# Patient Record
Sex: Female | Born: 1980 | Hispanic: Yes | Marital: Married | Smoking: Never smoker
Health system: Southern US, Community
[De-identification: ages and names within clinical notes are randomized; demographics above are authoritative.]

## PROBLEM LIST (undated history)

## (undated) ENCOUNTER — Inpatient Hospital Stay (HOSPITAL_COMMUNITY): Payer: Self-pay

## (undated) DIAGNOSIS — Z789 Other specified health status: Secondary | ICD-10-CM

## (undated) DIAGNOSIS — F32A Depression, unspecified: Secondary | ICD-10-CM

## (undated) DIAGNOSIS — D649 Anemia, unspecified: Secondary | ICD-10-CM

## (undated) DIAGNOSIS — R87619 Unspecified abnormal cytological findings in specimens from cervix uteri: Secondary | ICD-10-CM

## (undated) DIAGNOSIS — F329 Major depressive disorder, single episode, unspecified: Secondary | ICD-10-CM

## (undated) DIAGNOSIS — IMO0002 Reserved for concepts with insufficient information to code with codable children: Secondary | ICD-10-CM

## (undated) DIAGNOSIS — N83209 Unspecified ovarian cyst, unspecified side: Secondary | ICD-10-CM

## (undated) HISTORY — PX: OVARIAN CYST REMOVAL: SHX89

## (undated) HISTORY — PX: EXPLORATORY LAPAROTOMY: SUR591

---

## 2009-05-07 ENCOUNTER — Emergency Department (HOSPITAL_COMMUNITY): Admission: EM | Admit: 2009-05-07 | Discharge: 2009-05-08 | Payer: Self-pay | Admitting: Emergency Medicine

## 2010-01-21 ENCOUNTER — Ambulatory Visit
Admission: AD | Admit: 2010-01-21 | Discharge: 2010-01-21 | Payer: Self-pay | Source: Home / Self Care | Admitting: Family Medicine

## 2010-01-25 ENCOUNTER — Inpatient Hospital Stay (HOSPITAL_COMMUNITY)
Admission: AD | Admit: 2010-01-25 | Discharge: 2010-01-25 | Payer: Self-pay | Source: Home / Self Care | Attending: Family Medicine | Admitting: Family Medicine

## 2010-01-31 ENCOUNTER — Ambulatory Visit (HOSPITAL_COMMUNITY)
Admission: RE | Admit: 2010-01-31 | Discharge: 2010-01-31 | Payer: Self-pay | Source: Home / Self Care | Attending: Obstetrics & Gynecology | Admitting: Obstetrics & Gynecology

## 2010-02-07 ENCOUNTER — Ambulatory Visit: Payer: Self-pay | Admitting: Family Medicine

## 2010-02-17 NOTE — L&D Delivery Note (Signed)
Delivery Note At 8:35 AM a viable female was delivered via Vaginal, Spontaneous Delivery (Presentation: Right Occiput Anterior).  APGAR: 9, 9; weight 5 lb 11.4 oz (2590 g).   Placenta status: Intact, Spontaneous.  Cord: 3 vessels with the following complications: none.   Anesthesia: None  Episiotomy: None Lacerations: 1st degree Suture Repair: 3.0 monocryl Est. Blood Loss (mL): 200  Mom to postpartum.  Baby to nursery-stable.  [name] 09/22/2010, 9:01 AM

## 2010-04-05 ENCOUNTER — Inpatient Hospital Stay (HOSPITAL_COMMUNITY)
Admission: AD | Admit: 2010-04-05 | Discharge: 2010-04-05 | Disposition: A | Discharge status: Home / Self Care | Payer: Medicaid Other | Source: Ambulatory Visit | Attending: Obstetrics & Gynecology | Admitting: Obstetrics & Gynecology

## 2010-04-05 ENCOUNTER — Inpatient Hospital Stay (HOSPITAL_COMMUNITY): Payer: Medicaid Other

## 2010-04-05 DIAGNOSIS — J069 Acute upper respiratory infection, unspecified: Secondary | ICD-10-CM

## 2010-04-05 DIAGNOSIS — O9989 Other specified diseases and conditions complicating pregnancy, childbirth and the puerperium: Secondary | ICD-10-CM

## 2010-04-05 DIAGNOSIS — R109 Unspecified abdominal pain: Secondary | ICD-10-CM | POA: Insufficient documentation

## 2010-04-05 DIAGNOSIS — O99891 Other specified diseases and conditions complicating pregnancy: Secondary | ICD-10-CM | POA: Insufficient documentation

## 2010-04-05 LAB — URINALYSIS, ROUTINE W REFLEX MICROSCOPIC
Hgb urine dipstick: NEGATIVE
Nitrite: NEGATIVE
Specific Gravity, Urine: 1.02 (ref 1.005–1.030)
Urobilinogen, UA: 0.2 mg/dL (ref 0.0–1.0)
pH: 7.5 (ref 5.0–8.0)

## 2010-04-05 LAB — DIFFERENTIAL
Basophils Absolute: 0 10*3/uL (ref 0.0–0.1)
Basophils Relative: 0 % (ref 0–1)
Lymphocytes Relative: 12 % (ref 12–46)
Monocytes Relative: 9 % (ref 3–12)
Neutro Abs: 7.3 10*3/uL (ref 1.7–7.7)
Neutrophils Relative %: 78 % — ABNORMAL HIGH (ref 43–77)

## 2010-04-05 LAB — COMPREHENSIVE METABOLIC PANEL
ALT: 17 U/L (ref 0–35)
AST: 19 U/L (ref 0–37)
Alkaline Phosphatase: 41 U/L (ref 39–117)
CO2: 23 mEq/L (ref 19–32)
Chloride: 105 mEq/L (ref 96–112)
GFR calc Af Amer: >60 mL/min (ref >60)
GFR calc non Af Amer: >60 mL/min (ref >60)
Sodium: 134 mEq/L — ABNORMAL LOW (ref 135–145)
Total Bilirubin: 0.4 mg/dL (ref 0.3–1.2)

## 2010-04-05 LAB — CBC
Hemoglobin: 12.1 g/dL (ref 12.0–15.0)
RBC: 4.02 MIL/uL (ref 3.87–5.11)

## 2010-04-30 LAB — URINALYSIS, ROUTINE W REFLEX MICROSCOPIC
Bilirubin Urine: NEGATIVE
Ketones, ur: NEGATIVE mg/dL
Nitrite: NEGATIVE
Urobilinogen, UA: 0.2 mg/dL (ref 0.0–1.0)
pH: 7.5 (ref 5.0–8.0)

## 2010-04-30 LAB — CBC
MCH: 31 pg (ref 26.0–34.0)
MCV: 90.9 fL (ref 78.0–100.0)
Platelets: 246 10*3/uL (ref 150–400)
RBC: 4.2 MIL/uL (ref 3.87–5.11)

## 2010-05-08 ENCOUNTER — Other Ambulatory Visit: Payer: Self-pay | Admitting: Family Medicine

## 2010-05-08 DIAGNOSIS — Z3689 Encounter for other specified antenatal screening: Secondary | ICD-10-CM

## 2010-05-08 LAB — RPR: RPR: NONREACTIVE

## 2010-05-08 LAB — TYPE AND SCREEN: Antibody Screen: NEGATIVE

## 2010-05-08 LAB — HIV ANTIBODY (ROUTINE TESTING W REFLEX): HIV: NONREACTIVE

## 2010-05-13 ENCOUNTER — Ambulatory Visit (HOSPITAL_COMMUNITY)
Admission: RE | Admit: 2010-05-13 | Discharge: 2010-05-13 | Disposition: A | Discharge status: Home / Self Care | Payer: Medicaid Other | Source: Ambulatory Visit | Attending: Family Medicine | Admitting: Family Medicine

## 2010-05-13 ENCOUNTER — Encounter (HOSPITAL_COMMUNITY): Payer: Self-pay

## 2010-05-13 DIAGNOSIS — Z363 Encounter for antenatal screening for malformations: Secondary | ICD-10-CM | POA: Insufficient documentation

## 2010-05-13 DIAGNOSIS — Z1389 Encounter for screening for other disorder: Secondary | ICD-10-CM | POA: Insufficient documentation

## 2010-05-13 DIAGNOSIS — O358XX0 Maternal care for other (suspected) fetal abnormality and damage, not applicable or unspecified: Secondary | ICD-10-CM | POA: Insufficient documentation

## 2010-05-13 DIAGNOSIS — Z3689 Encounter for other specified antenatal screening: Secondary | ICD-10-CM

## 2010-05-13 LAB — URINALYSIS, ROUTINE W REFLEX MICROSCOPIC
Bilirubin Urine: NEGATIVE
Glucose, UA: NEGATIVE mg/dL
Hgb urine dipstick: NEGATIVE
Ketones, ur: NEGATIVE mg/dL
Nitrite: NEGATIVE
Protein, ur: NEGATIVE mg/dL
Specific Gravity, Urine: 1.013 (ref 1.005–1.030)
Urobilinogen, UA: 0.2 mg/dL (ref 0.0–1.0)
pH: 6.5 (ref 5.0–8.0)

## 2010-05-13 LAB — DIFFERENTIAL
Basophils Absolute: 0 10*3/uL (ref 0.0–0.1)
Basophils Relative: 0 % (ref 0–1)
Eosinophils Absolute: 0.2 K/uL (ref 0.0–0.7)
Eosinophils Relative: 1 % (ref 0–5)
Lymphocytes Relative: 27 % (ref 12–46)
Lymphs Abs: 4 K/uL (ref 0.7–4.0)
Monocytes Absolute: 1 K/uL (ref 0.1–1.0)
Monocytes Relative: 7 % (ref 3–12)
Neutro Abs: 9.8 10*3/uL — ABNORMAL HIGH (ref 1.7–7.7)
Neutrophils Relative %: 65 % (ref 43–77)

## 2010-05-13 LAB — POCT PREGNANCY, URINE: Preg Test, Ur: NEGATIVE

## 2010-05-13 LAB — CBC
HCT: 39.4 % (ref 36.0–46.0)
Hemoglobin: 12.9 g/dL (ref 12.0–15.0)
MCHC: 32.6 g/dL (ref 30.0–36.0)
MCV: 92.5 fL (ref 78.0–100.0)
Platelets: 263 10*3/uL (ref 150–400)
RBC: 4.26 MIL/uL (ref 3.87–5.11)
RDW: 14.3 % (ref 11.5–15.5)
WBC: 14.9 10*3/uL — ABNORMAL HIGH (ref 4.0–10.5)

## 2010-05-13 LAB — COMPREHENSIVE METABOLIC PANEL WITH GFR
ALT: 18 U/L (ref 0–35)
AST: 23 U/L (ref 0–37)
Albumin: 3.7 g/dL (ref 3.5–5.2)
Alkaline Phosphatase: 51 U/L (ref 39–117)
Calcium: 8.5 mg/dL (ref 8.4–10.5)
GFR calc Af Amer: >60 mL/min (ref >60)
Potassium: 3.5 meq/L (ref 3.5–5.1)
Sodium: 139 meq/L (ref 135–145)
Total Protein: 7 g/dL (ref 6.0–8.3)

## 2010-05-13 LAB — COMPREHENSIVE METABOLIC PANEL
BUN: 10 mg/dL (ref 6–23)
CO2: 22 mEq/L (ref 19–32)
Chloride: 110 mEq/L (ref 96–112)
Creatinine, Ser: 0.57 mg/dL (ref 0.4–1.2)
GFR calc non Af Amer: >60 mL/min (ref >60)
Glucose, Bld: 98 mg/dL (ref 70–99)
Total Bilirubin: 0.6 mg/dL (ref 0.3–1.2)

## 2010-05-13 LAB — WET PREP, GENITAL
Trich, Wet Prep: NONE SEEN
Yeast Wet Prep HPF POC: NONE SEEN

## 2010-05-13 LAB — GC/CHLAMYDIA PROBE AMP, GENITAL
Chlamydia, DNA Probe: NEGATIVE
GC Probe Amp, Genital: NEGATIVE

## 2010-05-13 LAB — RPR: RPR Ser Ql: NONREACTIVE

## 2010-05-13 LAB — LIPASE, BLOOD: Lipase: 25 U/L (ref 11–59)

## 2010-08-08 ENCOUNTER — Other Ambulatory Visit: Payer: Self-pay | Admitting: Obstetrics & Gynecology

## 2010-08-08 DIAGNOSIS — N920 Excessive and frequent menstruation with regular cycle: Secondary | ICD-10-CM

## 2010-08-09 ENCOUNTER — Ambulatory Visit (HOSPITAL_COMMUNITY)
Admission: RE | Admit: 2010-08-09 | Discharge: 2010-08-09 | Disposition: A | Discharge status: Home / Self Care | Payer: Self-pay | Source: Ambulatory Visit | Attending: Obstetrics & Gynecology | Admitting: Obstetrics & Gynecology

## 2010-08-09 DIAGNOSIS — Z3689 Encounter for other specified antenatal screening: Secondary | ICD-10-CM | POA: Insufficient documentation

## 2010-08-09 DIAGNOSIS — N920 Excessive and frequent menstruation with regular cycle: Secondary | ICD-10-CM

## 2010-08-09 DIAGNOSIS — O4690 Antepartum hemorrhage, unspecified, unspecified trimester: Secondary | ICD-10-CM | POA: Insufficient documentation

## 2010-08-29 ENCOUNTER — Inpatient Hospital Stay (HOSPITAL_COMMUNITY)
Admission: AD | Admit: 2010-08-29 | Discharge: 2010-08-30 | DRG: 778 | Disposition: A | Discharge status: Home / Self Care | Payer: Self-pay | Source: Ambulatory Visit | Attending: Obstetrics & Gynecology | Admitting: Obstetrics & Gynecology

## 2010-08-29 ENCOUNTER — Encounter (HOSPITAL_COMMUNITY): Payer: Self-pay | Admitting: *Deleted

## 2010-08-29 DIAGNOSIS — O4700 False labor before 37 completed weeks of gestation, unspecified trimester: Principal | ICD-10-CM | POA: Diagnosis present

## 2010-08-29 DIAGNOSIS — Z3480 Encounter for supervision of other normal pregnancy, unspecified trimester: Secondary | ICD-10-CM

## 2010-08-29 HISTORY — DX: Other specified health status: Z78.9

## 2010-08-29 LAB — CBC
HCT: 36.3 % (ref 36.0–46.0)
HCT: 37.4 % (ref 36.0–46.0)
Hemoglobin: 12.4 g/dL (ref 12.0–15.0)
MCH: 30.7 pg (ref 26.0–34.0)
MCHC: 34.2 g/dL (ref 30.0–36.0)
MCV: 89.7 fL (ref 78.0–100.0)
RDW: 13.5 % (ref 11.5–15.5)
WBC: 10.9 10*3/uL — ABNORMAL HIGH (ref 4.0–10.5)

## 2010-08-29 LAB — URINALYSIS, ROUTINE W REFLEX MICROSCOPIC
Glucose, UA: NEGATIVE mg/dL
Leukocytes, UA: NEGATIVE
Nitrite: NEGATIVE
Specific Gravity, Urine: 1.01 (ref 1.005–1.030)
pH: 6.5 (ref 5.0–8.0)

## 2010-08-29 LAB — URINE MICROSCOPIC-ADD ON

## 2010-08-29 LAB — WET PREP, GENITAL: Yeast Wet Prep HPF POC: NONE SEEN

## 2010-08-29 MED ORDER — OXYTOCIN 20 UNITS IN LACTATED RINGERS INFUSION - SIMPLE: 125.0000 mL/h | Freq: Once | INTRAVENOUS | Status: DC

## 2010-08-29 MED ORDER — LACTATED RINGERS IV SOLN
INTRAVENOUS | Status: DC
  Administered 2010-08-29 – 2010-08-30 (×3): via INTRAVENOUS

## 2010-08-29 MED ORDER — OXYCODONE-ACETAMINOPHEN 5-325 MG PO TABS: 2.0000 | ORAL_TABLET | ORAL | Status: DC | PRN

## 2010-08-29 MED ORDER — FOLIC ACID 1 MG PO TABS
1.0000 mg | ORAL_TABLET | Freq: Every day | ORAL | Status: DC
  Administered 2010-08-30: 1 mg via ORAL
  Filled 2010-08-29 (×3): qty 1

## 2010-08-29 MED ORDER — ACETAMINOPHEN 325 MG PO TABS: 650.0000 mg | ORAL_TABLET | ORAL | Status: DC | PRN

## 2010-08-29 MED ORDER — LACTATED RINGERS IV SOLN: 500.0000 mL | INTRAVENOUS | Status: DC | PRN

## 2010-08-29 MED ORDER — PRENATAL PLUS 27-1 MG PO TABS
1.0000 | ORAL_TABLET | Freq: Every day | ORAL | Status: DC
  Administered 2010-08-30: 1 via ORAL
  Filled 2010-08-29: qty 1

## 2010-08-29 MED ORDER — IBUPROFEN 600 MG PO TABS: 600.0000 mg | ORAL_TABLET | Freq: Four times a day (QID) | ORAL | Status: DC | PRN

## 2010-08-29 MED ORDER — FLEET ENEMA 7-19 GM/118ML RE ENEM: 1.0000 | ENEMA | RECTAL | Status: DC | PRN

## 2010-08-29 MED ORDER — CITRIC ACID-SODIUM CITRATE 334-500 MG/5ML PO SOLN: 30.0000 mL | ORAL | Status: DC | PRN

## 2010-08-29 MED ORDER — LIDOCAINE HCL (PF) 1 % IJ SOLN: 30.0000 mL | Freq: Once | INTRAMUSCULAR | Status: AC | PRN

## 2010-08-29 MED ORDER — ONDANSETRON HCL 4 MG/2ML IJ SOLN: 4.0000 mg | Freq: Four times a day (QID) | INTRAMUSCULAR | Status: DC | PRN

## 2010-08-29 NOTE — Progress Notes (Signed)
Marissa Little is a 30 y.o. G4P1021 at [redacted]w[redacted]d admitted for latent labor   Subjective: Pt c/o mild vag pressure, mild vaginal bleeding. No acute complaints  Objective: BP 111/61  Pulse 68  Temp(Src) 98.2 F (36.8 C) (Oral)  Resp 18  Ht 4\' 8"  (1.422 m)  Wt 61.236 kg (135 lb)  BMI 30.27 kg/m2      FHT:  FHR: 150 bpm, variability: moderate,  accelerations:  Present,  decelerations:  Absent UC:   irregular, every 2-3 minutes SVE:   Dilation: 4 Effacement (%): 60 Station: -2 Exam by:: Dr. Orvan Falconer +bloody show, no clinical evidence of abruption  Labs: Lab Results  Component Value Date   WBC 11.4* 08/29/2010   HGB 12.8 08/29/2010   HCT 37.4 08/29/2010   MCV 89.7 08/29/2010   PLT 187 08/29/2010    Assessment / Plan: Spontaneous labor, progressing normally  Labor: Progressing normally cont expectant management Fetal Wellbeing:  Category I Pain Control:  Labor support without medications I/D:  n/a Anticipated MOD:  NSVD  Marissa Little 08/29/2010, 6:57 PM

## 2010-08-29 NOTE — Initial Assessments (Signed)
Pt started having bleeding on Tuesday, passed some clots, bleeding continued Wednesday - bleeding like a period,  Pt was seen @ [facility] today, continues to have bleeding, sent to MAU.  Pt also C/O lower abd pain since Tuesday, decreased fetal movement since yesterday.

## 2010-08-29 NOTE — Progress Notes (Signed)
  Marissa Little is a 30 y.o. [id number] at [redacted]w[redacted]d admitted for latent labor.  Subjective: Pt doing well with her contractions. No acute complaints.   Objective: BP 91/57  Pulse 67  Temp(Src) 98.1 F (36.7 C) (Oral)  Resp 18  Ht 4\' 8"  (1.422 m)  Wt 61.236 kg (135 lb)  BMI 30.27 kg/m2      FHT:  FHR: 140 bpm, variability: moderate,  accelerations:  Present,  decelerations:  Absent UC:   regular, every 2-3 minutes SVE:   Dilation: 4.5 Effacement (%): 60 Station: -2;Ballotable Exam by:: [email or web address],rnc SVE unchanged: 4.5/60/-2 at 2355. +bloody show  Labs: Lab Results  Component Value Date   WBC 11.4* 08/29/2010   HGB 12.8 08/29/2010   HCT 37.4 08/29/2010   MCV 89.7 08/29/2010   PLT 187 08/29/2010    Assessment / Plan: latent labor  Labor: cont expectant management due to preterm status. Fetal Wellbeing:  Category I Pain Control:  Labor support without medications I/D:  n/a Anticipated MOD:  NSVD  Ndia Sampath 08/29/2010, 11:53 PM

## 2010-08-29 NOTE — Progress Notes (Signed)
08/29/2010 Marissa Little  Interpreter  I assisted Dr. Natale Milch with plan of care.

## 2010-08-29 NOTE — Progress Notes (Signed)
Passed small clot on Tues.  Decreased fetal movement since yesterday.

## 2010-08-29 NOTE — H&P (Addendum)
Subjective:  Marissa Little is a 30 y.o. G4 P56 female with EDC 09/25/10 at 68 and 1/[redacted] weeks gestation who is being admitted for Preterm labor.  Pt with hx of 2 previous ectopic pregnancies with removal of left ovary and previous vaginal delivery in [place].  Patient reports bleeding, no leaking and contractions which began early today.   Fetal Movement: normal.     Objective:   Vital signs in last 24 hours: Temp:  [98.1 F (36.7 C)] 98.1 F (36.7 C) (07/12 1211) Pulse Rate:  [77] 77  (07/12 1211) Resp:  [18] 18  (07/12 1211) BP: (108)/(70) 108/70 mmHg (07/12 1211) Weight:  [130 lb 12.8 oz (59.33 kg)] 130 lb 12.8 oz (59.33 kg) (07/12 1211)   General:   alert, cooperative and moderate distress  Skin:   normal  HEENT:  PERRLA  Lungs:   clear to auscultation anteriorly  Heart:   regular rate and rhythm, S1, S2 normal, no murmur, click, rub or gallop  Abdomen:  soft, non-tender; bowel sounds normal; no masses,  no organomegaly  Pelvis:  External genitalia: normal general appearance  FHT:  150 BPM  Presentations: cephalic  Cervix:    Dilation: 3.5cm   Effacement: 50%   Station:  -2   Consistency: soft   Position: anterior   Lab Review  O, Rh+, Rubella-immune, Hepatitis B surface antigen non-reactive, GBS negative  One hour GTT: Normal    Assessment/Plan:  36 and 1/[redacted] weeks gestation. Active phase labor.  Admit to L&D.  Pt s/e with PA-S. Agree with above.

## 2010-08-29 NOTE — ED Provider Notes (Addendum)
History   Chief Complaint:  Vaginal Bleeding   Marissa Little is  30 y.o. [id number].  No LMP recorded. Patient is pregnant..  Her pregnancy status is positive.  She presents complaining of Vaginal Bleeding Pt was sent from the HD today after having passed a clot on Tuesday and now having decreased fetal movement.  Per pt, on Tuesday afternoon, she began having vaginal bleeding, bright red and heavy like a period.  This lasted all night Tues night into Wed morning.  She then began passing small clots and some brown-colored blood.  Today while at the HD, she began having bright red bleeding again after having a vaginal exam.  Pt had one episode of vomiting and diarrhea yesterday.  Has been able to eat and drink since then without any problems.  Also complains of pain with urination and a feeling of incomplete bladder emptying.  No fevers or chills.  No N/V/D other than the one episode yesterday.  No CP. No contractions but having painful cramping, esp in the lower abdomen.  +FM but somewhat decreased from normal.  No LOF or gushes as if water broke.  Was 1cm at HD today.  Onset is described as ongoing and has been present for  3 days.      Past Medical History  Diagnosis Date  . No pertinent past medical history     Past Surgical History  Procedure Date  . Ovarian cyst removal     No family history on file.  History  Substance Use Topics  . Smoking status: Never Smoker   . Smokeless tobacco: Not on file  . Alcohol Use: No    Allergies: Allergies no known allergies  No prescriptions prior to admission      Physical Exam   Blood pressure 108/70, pulse 77, temperature 98.1 F (36.7 C), temperature source Oral, resp. rate 18, height 4\' 10"  (1.473 m), weight 130 lb 12.8 oz (59.33 kg).  General: General appearance - alert, well appearing, and in no distress and well hydrated Heart - normal rate and regular rhythm, no murmurs noted Abdomen - tenderness noted most sig in suprapubic  area; gravid;  bowel sounds normal Extremities - peripheral pulses normal, no pedal edema, no clubbing or cyanosis Focused Gynecological Exam: not done at this time  Labs: No results found for this or any previous visit (from the past 24 hour(s)).    Assessment: There is no problem list on file for this patient. 30 yo G4P1021 @ 36 wks sent by HD for vaginal bleeding w/ syptoms c/w UTI   Plan: 1. Check UA to r/o UTI, CBC 2. Continuous monitoring for ctx and FHT  MCGILL,JACQUELYN    ________________________________________ Speculum Exam @ 1430 Beefy red mucosal surface to cervix. Bloody mucousy discharge noted. Sampless taken and sent for Wet prep and GC/Ch. Cervix was 3.5/10/-1 by digital exam. FHT Cat I with ctx every 3-4 minutes, but pt declined pain medication. Will Cont monitoring and recheck cervix in approximately 1 hour.  _______________ Pt admitted to L&D

## 2010-08-29 NOTE — Progress Notes (Signed)
08/29/2010 Marissa Little  Interpreter  I assisted [employer] fast streamer with questions

## 2010-08-30 LAB — GC/CHLAMYDIA PROBE AMP, GENITAL: Chlamydia, DNA Probe: NEGATIVE

## 2010-08-30 NOTE — Progress Notes (Signed)
  Marissa Little is a 30 y.o. [phone] at [redacted]w[redacted]d admitted for ?active labor  Subjective: Doing well, feeling some ctx, baby moving well, still having some vaginal bleeding  Objective: BP 125/86  Pulse 71  Temp(Src) 97.9 F (36.6 C) (Oral)  Resp 20  Ht 4\' 8"  (1.422 m)  Wt 135 lb (61.236 kg)  BMI 30.27 kg/m2 I/O last 3 completed shifts: In: 1447.9 [I.V.:1447.9] Out: -     FHT:  FHR: 130 bpm, variability: moderate,  accelerations:  Present,  decelerations:  Absent UC:   irregular, every 3-7 minutes SVE:   4/60/-2  Labs: Lab Results  Component Value Date   WBC 11.4* 08/29/2010   HGB 12.8 08/29/2010   HCT 37.4 08/29/2010   MCV 89.7 08/29/2010   PLT 187 08/29/2010    Assessment / Plan: 36 week G4P1021 in ?labor; cervical change in MAU but only minimal change since being on L&D  Labor: will have pt walk x2-3 hrs and then recheck cervix.  If no change, will d/c pt home, otherwise will continue expectant mgmt if cervix is >/= 5cm Fetal Wellbeing:  Category I Pain Control:  Labor support without medications    [name] 08/30/2010, 10:03 AM

## 2010-08-30 NOTE — Progress Notes (Signed)
08/30/2010 Calen Geister  Interpreter   Stopped by to check on patient.

## 2010-08-30 NOTE — Progress Notes (Signed)
08/30/2010 Ota Ebersole  Interpreter  I assisted [facility] with plan of care.

## 2010-08-30 NOTE — Discharge Summary (Signed)
Obstetric Discharge Summary Reason for Admission: Labor  Hemoglobin  Date Value Range Status  08/29/2010 12.8  12.0-15.0 (g/dL) Final     HCT  Date Value Range Status  08/29/2010 37.4  36.0-46.0 (%) Final    Discharge Diagnoses: False labor-undelivered  Discharge Information: Date: 08/30/2010 Activity: unrestricted Diet: routine Medications: PNV Condition: stable Instructions: See specific Discharge instructions on false labor Discharge to: home Follow-up Information    Follow up with [facility] on 09/05/2010. (At 1030 AM)          Chancy Milroy 08/30/2010, 3:09 PM

## 2010-08-30 NOTE — Progress Notes (Signed)
Marissa Little is a 30 y.o. [phone] at [redacted]w[redacted]d admitted for latent labor  Subjective: Pt has no acute complaint. Managing contractions without pain medication.   Objective: BP 100/67  Pulse 63  Temp(Src) 97.8 F (36.6 C) (Oral)  Resp 20  Ht 4\' 8"  (1.422 m)  Wt 135 lb (61.236 kg)  BMI 30.27 kg/m2      FHT:  FHR: 130 bpm, variability: moderate,  accelerations:  Present,  decelerations:  Absent UC:   regular, every 2-3 minutes SVE:   Dilation: 4-5 Effacement: 60 Station: -2 Exam done by Marena Chancy and confirmed by [employer]: Lab Results  Component Value Date   WBC 11.4* 08/29/2010   HGB 12.8 08/29/2010   HCT 37.4 08/29/2010   MCV 89.7 08/29/2010   PLT 187 08/29/2010    Assessment / Plan: Latent phase of labor  Labor: latent phase of labor. Continue expectant management due to preterm status Fetal Wellbeing:  Category I Pain Control:  Labor support without medications Anticipated MOD:  NSVD  Ladislao Cohenour 08/30/2010, 3:48 AM

## 2010-08-30 NOTE — Progress Notes (Signed)
Marissa Little is a 30 y.o. G4P1021 at [redacted]w[redacted]d by ultrasound admitted for labor, however, her contractions have stopped. She walked for several hours to see if they would return. Contractions have returned somewhat, but not to the degree she was at when she was admitted yesterday.  Objective: BP 103/63  Pulse 70  Temp(Src) 97.3 F (36.3 C) (Oral)  Resp 18  Ht 4\' 8"  (1.422 m)  Wt 135 lb (61.236 kg)  BMI 30.27 kg/m2 I/O last 3 completed shifts: In: 1447.9 [I.V.:1447.9] Out: -     FHT:  FHR: 140 bpm, variability: moderate,  accelerations:  Present,  decelerations:  Absent UC:   none, regular, every 6-8 minutes SVE: 3/50/-2  Labs: Lab Results  Component Value Date   WBC 11.4* 08/29/2010   HGB 12.8 08/29/2010   HCT 37.4 08/29/2010   MCV 89.7 08/29/2010   PLT 187 08/29/2010    Assessment / Plan: Protracted latent phase  Labor: Stopped Fetal Wellbeing:  Category I Pain Control:  Labor support without medications  Will discharge pt home. Labor has protracted/arrested without getting into active phase. Labor precautions discussed with pt. She will f/u at HD on 19 July at 10:30 as noted in discharge summary.   Lyanna Blystone N 08/30/2010, 3:05 PM

## 2010-09-22 ENCOUNTER — Inpatient Hospital Stay (HOSPITAL_COMMUNITY)
Admission: AD | Admit: 2010-09-22 | Discharge: 2010-09-24 | DRG: 775 | Disposition: A | Discharge status: Home / Self Care | Payer: Medicaid Other | Source: Ambulatory Visit | Attending: Obstetrics & Gynecology | Admitting: Obstetrics & Gynecology

## 2010-09-22 ENCOUNTER — Encounter (HOSPITAL_COMMUNITY): Payer: Self-pay

## 2010-09-22 DIAGNOSIS — Z3480 Encounter for supervision of other normal pregnancy, unspecified trimester: Secondary | ICD-10-CM

## 2010-09-22 HISTORY — DX: Unspecified ovarian cyst, unspecified side: N83.209

## 2010-09-22 HISTORY — DX: Unspecified abnormal cytological findings in specimens from cervix uteri: R87.619

## 2010-09-22 HISTORY — DX: Depression, unspecified: F32.A

## 2010-09-22 HISTORY — DX: Major depressive disorder, single episode, unspecified: F32.9

## 2010-09-22 HISTORY — DX: Anemia, unspecified: D64.9

## 2010-09-22 HISTORY — DX: Reserved for concepts with insufficient information to code with codable children: IMO0002

## 2010-09-22 LAB — CBC
Platelets: 165 10*3/uL (ref 150–400)
RBC: 4.28 MIL/uL (ref 3.87–5.11)
RDW: 13.5 % (ref 11.5–15.5)
WBC: 11.6 10*3/uL — ABNORMAL HIGH (ref 4.0–10.5)

## 2010-09-22 LAB — RPR: RPR Ser Ql: NONREACTIVE

## 2010-09-22 MED ORDER — SIMETHICONE 80 MG PO CHEW: 80.0000 mg | CHEWABLE_TABLET | ORAL | Status: DC | PRN

## 2010-09-22 MED ORDER — IBUPROFEN 600 MG PO TABS: 600.0000 mg | ORAL_TABLET | Freq: Four times a day (QID) | ORAL | Status: DC | PRN

## 2010-09-22 MED ORDER — OXYCODONE-ACETAMINOPHEN 5-325 MG PO TABS: 2.0000 | ORAL_TABLET | ORAL | Status: DC | PRN

## 2010-09-22 MED ORDER — LACTATED RINGERS IV SOLN: 500.0000 mL | INTRAVENOUS | Status: DC | PRN

## 2010-09-22 MED ORDER — SENNOSIDES-DOCUSATE SODIUM 8.6-50 MG PO TABS
2.0000 | ORAL_TABLET | Freq: Every day | ORAL | Status: DC
  Administered 2010-09-22 – 2010-09-23 (×2): 2 via ORAL

## 2010-09-22 MED ORDER — DIBUCAINE 1 % RE OINT: 1.0000 "application " | TOPICAL_OINTMENT | RECTAL | Status: DC | PRN

## 2010-09-22 MED ORDER — IBUPROFEN 600 MG PO TABS
600.0000 mg | ORAL_TABLET | Freq: Four times a day (QID) | ORAL | Status: DC
  Administered 2010-09-22 – 2010-09-24 (×8): 600 mg via ORAL
  Filled 2010-09-22 (×8): qty 1

## 2010-09-22 MED ORDER — ACETAMINOPHEN 325 MG PO TABS: 650.0000 mg | ORAL_TABLET | ORAL | Status: DC | PRN

## 2010-09-22 MED ORDER — BENZOCAINE-MENTHOL 20-0.5 % EX AERO
1.0000 "application " | INHALATION_SPRAY | CUTANEOUS | Status: DC | PRN
  Administered 2010-09-22: 1 via TOPICAL

## 2010-09-22 MED ORDER — OXYTOCIN 20 UNITS IN LACTATED RINGERS INFUSION - SIMPLE
125.0000 mL/h | Freq: Once | INTRAVENOUS | Status: DC
  Filled 2010-09-22: qty 1000

## 2010-09-22 MED ORDER — PRENATAL PLUS 27-1 MG PO TABS
1.0000 | ORAL_TABLET | Freq: Every day | ORAL | Status: DC
  Administered 2010-09-23: 1 via ORAL
  Filled 2010-09-22: qty 1

## 2010-09-22 MED ORDER — ONDANSETRON HCL 4 MG/2ML IJ SOLN: 4.0000 mg | INTRAMUSCULAR | Status: DC | PRN

## 2010-09-22 MED ORDER — NALBUPHINE SYRINGE 5 MG/0.5 ML
5.0000 mg | INJECTION | INTRAMUSCULAR | Status: DC | PRN
  Administered 2010-09-22 (×2): 5 mg via INTRAVENOUS
  Filled 2010-09-22 (×3): qty 0.5

## 2010-09-22 MED ORDER — OXYTOCIN 10 UNIT/ML IJ SOLN
INTRAMUSCULAR | Status: AC
  Filled 2010-09-22: qty 2

## 2010-09-22 MED ORDER — ONDANSETRON HCL 4 MG/2ML IJ SOLN: 4.0000 mg | Freq: Four times a day (QID) | INTRAMUSCULAR | Status: DC | PRN

## 2010-09-22 MED ORDER — ZOLPIDEM TARTRATE 5 MG PO TABS: 5.0000 mg | ORAL_TABLET | Freq: Every evening | ORAL | Status: DC | PRN

## 2010-09-22 MED ORDER — NALOXONE HCL 0.4 MG/ML IJ SOLN
INTRAMUSCULAR | Status: AC
  Filled 2010-09-22: qty 1

## 2010-09-22 MED ORDER — FLEET ENEMA 7-19 GM/118ML RE ENEM: 1.0000 | ENEMA | RECTAL | Status: DC | PRN

## 2010-09-22 MED ORDER — TETANUS-DIPHTH-ACELL PERTUSSIS 5-2.5-18.5 LF-MCG/0.5 IM SUSP
0.5000 mL | Freq: Once | INTRAMUSCULAR | Status: AC
  Administered 2010-09-23: 0.5 mL via INTRAMUSCULAR
  Filled 2010-09-22: qty 0.5

## 2010-09-22 MED ORDER — WITCH HAZEL-GLYCERIN EX PADS: 1.0000 "application " | MEDICATED_PAD | CUTANEOUS | Status: DC | PRN

## 2010-09-22 MED ORDER — BENZOCAINE-MENTHOL 20-0.5 % EX AERO
INHALATION_SPRAY | CUTANEOUS | Status: AC
  Administered 2010-09-22: 1 via TOPICAL
  Filled 2010-09-22: qty 56

## 2010-09-22 MED ORDER — LACTATED RINGERS IV SOLN
INTRAVENOUS | Status: DC
  Administered 2010-09-22: 1000 mL via INTRAVENOUS

## 2010-09-22 MED ORDER — LANOLIN HYDROUS EX OINT: TOPICAL_OINTMENT | CUTANEOUS | Status: DC | PRN

## 2010-09-22 MED ORDER — OXYCODONE-ACETAMINOPHEN 5-325 MG PO TABS: 1.0000 | ORAL_TABLET | ORAL | Status: DC | PRN

## 2010-09-22 MED ORDER — DIPHENHYDRAMINE HCL 25 MG PO CAPS: 25.0000 mg | ORAL_CAPSULE | Freq: Four times a day (QID) | ORAL | Status: DC | PRN

## 2010-09-22 MED ORDER — LIDOCAINE HCL (PF) 1 % IJ SOLN
30.0000 mL | INTRAMUSCULAR | Status: DC | PRN
  Filled 2010-09-22 (×2): qty 30

## 2010-09-22 MED ORDER — CITRIC ACID-SODIUM CITRATE 334-500 MG/5ML PO SOLN: 30.0000 mL | ORAL | Status: DC | PRN

## 2010-09-22 MED ORDER — ONDANSETRON HCL 4 MG PO TABS: 4.0000 mg | ORAL_TABLET | ORAL | Status: DC | PRN

## 2010-09-22 NOTE — Progress Notes (Signed)
Pt complete, will monitor fhr q 5 min and document q 15 min.  Dr Bascom Levels and spanish interpreter paged for delivery.

## 2010-09-22 NOTE — H&P (Signed)
Marissa Little is a 30 y.o. female [phone] with IUP at [redacted]w[redacted]d presenting for contractions that started at 4am. She complains of bloody show seen yesterday. She denies any loss of fluid. Reports good fetal movement. [facility] at [facility] since 20 wks.  Prenatal History/Complications: - Admitted on July 12th 2012 for contractions and early labor at 36.2wga, but was discharged for protracted/arrested phase, not in active labor. - ex lap 01/21/10 showing normal ovaries, Right tube with adhesions and possible blind ending pouch 2/3 of way through tube  Past Medical History: Past Medical History  Diagnosis Date  . No pertinent past medical history   . Anemia   . Ovarian cyst   . Depression   . Abnormal Pap smear     Past Surgical History: Past Surgical History  Procedure Date  . Ovarian cyst removal   . Exploratory laparotomy     ovarian    Obstetrical History: OB History    Grav Para Term Preterm Abortions TAB SAB Ect Mult Living   4 1 1  2   2  1     #1: 09/02/1998, 8.8lbs, NSVD, 40wga, labor: 3hrs #2: 2009, ectopic at 59mo, methotrexate #3: 2010: ectopic at 59mos, methotrexate #4: current  Gynecological History: Abnormal pap: ASCUS neg for high risk HPV Ovarian cyst removed at 30yo Ex lap 01/21/10  Social History: History   Social History  . Marital Status: Married    Spouse Name: N/A    Number of Children: N/A  . Years of Education: N/A   Occupational History  . [employer]   Social History Main Topics  . Smoking status: Never Smoker   . Smokeless tobacco: None  . Alcohol Use: No  . Drug Use: No  . Sexually Active: Yes   Other Topics Concern  . None   Social History Narrative  . None    Family History: Family History  Problem Relation Age of Onset  . Hypertension Mother   . Hyperlipidemia Mother   . Diabetes Father     Allergies: No Known Allergies  Prescriptions prior to admission  Medication Sig Dispense Refill  . folic acid  (FOLVITE) 1 MG tablet Take 1 mg by mouth daily.        . prenatal vitamin w/FE, FA (PRENATAL 1 + 1) 27-1 MG TABS Take 1 tablet by mouth daily.          Review of Systems - Negative except per HPI   Blood pressure 120/88, pulse 68, temperature 98.6 F (37 C), temperature source Oral, resp. rate 22, height 4\' 8"  (1.422 m), weight 130 lb (58.968 kg). General appearance: moderate distress with contractions Lungs: clear to auscultation bilaterally Heart: regular rate and rhythm, S1, S2 normal, no murmur, click, rub or gallop Abdomen: gravid Extremities: extremities normal, atraumatic, no cyanosis or edema cephalic Baseline: 125 bpm, Variability: Good {> 6 bpm), Accelerations: Reactive and Decelerations: Absent Frequency: Every 2-3 minutes Dilation: 7 Effacement (%): 80 Station: 0 Exam by:: Marissa Shanks RN   Prenatal labs: ABO, Rh: O POS (12/05 1943) Antibody: Negative (03/21 0000) Rubella:  immune RPR: NON REACTIVE (07/12 1725)  HBsAg: Negative (03/21 0000)  HIV: Non-reactive (07/12 1707)  GBS: Negative (07/05 0000)  1 hr Glucola: 92 Genetic screening: wnl Anatomy US on 08/08/10: mild lateral pyelectasis <52mm, but acceptable for gestational age. No postnatal follow up necessary.   Assessment: Marissa Little is a 30 y.o. [phone] with an IUP at [redacted]w[redacted]d presenting for active labor  Plan: 1. Expectant management  on L&D.  2. Pain management: declines epidural or IV meds  3. Fetal well being: category I   Marissa Little 09/22/2010, 6:07 AM

## 2010-09-22 NOTE — Plan of Care (Signed)
Dr Gwenlyn Saran notified of pateint, tracing, sve result. Order to admit to l/d unit

## 2010-09-22 NOTE — Progress Notes (Signed)
Documentation in error

## 2010-09-22 NOTE — Progress Notes (Signed)
SVD of female by Dr Bascom Levels,  See L&D summary and md's progress notes for events of delivery.

## 2010-09-23 NOTE — Progress Notes (Signed)
UR chart review completed.  

## 2010-09-23 NOTE — Progress Notes (Signed)
09/23/2010 Leocadia Idleman  Interpreter  I assisted [facility] with plan of care.  

## 2010-09-23 NOTE — Progress Notes (Signed)
BABY HAS BEEN ONLY BOTTLE FED.  NIPPLES BOTH INVERTED.  BREASTS FILLING, AREOLA FULL.  ASSIST ATTEMPTED WITH #20 NIPPLE SHIELD BUT BABY UNABLE TO SUSTAIN DEEP LATCH.  DEBP SETUP AND INITIATED.  COLOSTRUM OBTAINED AND BOTTLE FED TO BABY.  INSTRUCTED TO PUMP BOTH BREAST EVERY 3 HOURS X 15 MIN.  ENCOURAGED TO CALL FOR ASSIST PRN.

## 2010-09-23 NOTE — Progress Notes (Signed)
Post Partum Day 1 Subjective: no complaints, up ad lib, voiding, tolerating PO and + flatus  Objective: Blood pressure 93/53, pulse 61, temperature 98.6 F (37 C), temperature source Oral, resp. rate 18, height 4\' 8"  (1.422 m), weight 130 lb (58.968 kg), SpO2 98.00%, unknown if currently breastfeeding.  Physical Exam:  General: alert, cooperative, appears stated age and no distress Lochia: appropriate Uterine Fundus: firm Incision: no incision DVT Evaluation: No evidence of DVT seen on physical exam.   Basename 09/22/10 0604  HGB 13.3  HCT 38.1    Assessment/Plan: Discharge home and Breastfeeding, birth control is possibly depo vs none   LOS: 1 day   Marissa Little 09/23/2010, 7:14 AM

## 2010-09-24 ENCOUNTER — Encounter (HOSPITAL_COMMUNITY)
Admission: RE | Admit: 2010-09-24 | Discharge: 2010-09-24 | Disposition: A | Discharge status: Home / Self Care | Payer: Self-pay | Source: Ambulatory Visit | Attending: Obstetrics & Gynecology | Admitting: Obstetrics & Gynecology

## 2010-09-24 DIAGNOSIS — O923 Agalactia: Secondary | ICD-10-CM | POA: Insufficient documentation

## 2010-09-24 MED ORDER — IBUPROFEN 600 MG PO TABS: 600.0000 mg | ORAL_TABLET | Freq: Four times a day (QID) | ORAL | Status: AC

## 2010-09-24 MED ORDER — DOCUSATE SODIUM 100 MG PO CAPS: 100.0000 mg | ORAL_CAPSULE | Freq: Two times a day (BID) | ORAL | Status: AC

## 2010-09-24 NOTE — Discharge Summary (Signed)
  Obstetric Discharge Summary Reason for Admission: onset of labor Prenatal Procedures: ultrasound Intrapartum Procedures: spontaneous vaginal delivery Postpartum Procedures: none Complications-Operative and Postpartum: 1st degree perineal laceration.  Hemoglobin  Date Value Range Status  09/22/2010 13.3  12.0-15.0 (g/dL) Final     HCT  Date Value Range Status  09/22/2010 38.1  36.0-46.0 (%) Final    Discharge Diagnoses: Term Pregnancy-delivered  Discharge Information: Date: 09/24/2010 Activity: pelvic rest Diet: routine Medications: Ibuprophen and Colace Condition: stable Instructions: refer to practice specific booklet Contraception: Depo Breastfeeding and formula feeding Discharge to: home Follow-up Information    Follow up with [facility] [place]. Make an appointment in 6 weeks. (for postpartum visit)    Contact information:   [address] [place] [address]          Newborn Data: Live born female  Birth Weight: 5 lb 11.4 oz (2590 g) APGAR: 9, 9  Home with mother.  Marena Chancy 09/24/2010, 7:20 AM

## 2010-09-24 NOTE — Progress Notes (Signed)
  Post Partum Day 2 Subjective: no complaints, up ad lib, voiding, tolerating PO, + flatus and abdominal pain controled with emds. Breastfeeding and formula feeding. Plans on Depo for contraception.   Objective: Blood pressure 113/80, pulse 76, temperature 97.5 F (36.4 C), temperature source Oral, resp. rate 18, height 4\' 8"  (1.422 m), weight 130 lb (58.968 kg), SpO2 98.00%, unknown if currently breastfeeding.  Physical Exam:  General: alert, cooperative and no distress Lochia: appropriate Uterine Fundus: firm, at umbilicus DVT Evaluation: No evidence of DVT seen on physical exam. Negative Homan's sign.   Basename 09/22/10 0604  HGB 13.3  HCT 38.1    Assessment/Plan: Discharge home, Breastfeeding and Contraception Depo. Discharge with colace and ibuprofen.    LOS: 2 days   Jazlin Tapscott 09/24/2010, 7:17 AM

## 2010-09-24 NOTE — Consult Note (Signed)
Mom refusing to pump reporting have tried shells, shield and pre pumping and still cannot get to latch correctly.  Small baby getting fussy.  Mom willing to keep pumping and give breastmilk.  Mom's breasts are full.  Reviewed through interpreter to pump every 3 hours for 20 minutes.  [employer]act[employer] pump rented.

## 2010-10-25 ENCOUNTER — Encounter (HOSPITAL_COMMUNITY)
Admission: RE | Admit: 2010-10-25 | Discharge: 2010-10-25 | Disposition: A | Discharge status: Home / Self Care | Payer: Medicaid Other | Source: Ambulatory Visit | Attending: Obstetrics & Gynecology | Admitting: Obstetrics & Gynecology

## 2010-10-25 DIAGNOSIS — O923 Agalactia: Secondary | ICD-10-CM | POA: Insufficient documentation

## 2010-11-25 ENCOUNTER — Encounter (HOSPITAL_COMMUNITY)
Admission: RE | Admit: 2010-11-25 | Discharge: 2010-11-25 | Disposition: A | Discharge status: Home / Self Care | Payer: Self-pay | Source: Ambulatory Visit | Attending: Obstetrics & Gynecology | Admitting: Obstetrics & Gynecology

## 2010-11-25 DIAGNOSIS — O923 Agalactia: Secondary | ICD-10-CM | POA: Insufficient documentation

## 2010-12-26 ENCOUNTER — Encounter (HOSPITAL_COMMUNITY)
Admission: RE | Admit: 2010-12-26 | Discharge: 2010-12-26 | Disposition: A | Discharge status: Home / Self Care | Payer: Self-pay | Source: Ambulatory Visit | Attending: Obstetrics & Gynecology | Admitting: Obstetrics & Gynecology

## 2010-12-26 DIAGNOSIS — O923 Agalactia: Secondary | ICD-10-CM | POA: Insufficient documentation

## 2011-01-26 ENCOUNTER — Encounter (HOSPITAL_COMMUNITY)
Admission: RE | Admit: 2011-01-26 | Discharge: 2011-01-26 | Disposition: A | Discharge status: Home / Self Care | Payer: Self-pay | Source: Ambulatory Visit | Attending: Obstetrics & Gynecology | Admitting: Obstetrics & Gynecology

## 2011-01-26 DIAGNOSIS — O923 Agalactia: Secondary | ICD-10-CM | POA: Insufficient documentation

## 2011-02-26 ENCOUNTER — Encounter (HOSPITAL_COMMUNITY)
Admission: RE | Admit: 2011-02-26 | Discharge: 2011-02-26 | Disposition: A | Discharge status: Home / Self Care | Payer: Self-pay | Source: Ambulatory Visit | Attending: Obstetrics & Gynecology | Admitting: Obstetrics & Gynecology

## 2011-02-26 DIAGNOSIS — O923 Agalactia: Secondary | ICD-10-CM | POA: Insufficient documentation

## 2011-03-29 ENCOUNTER — Encounter (HOSPITAL_COMMUNITY)
Admission: RE | Admit: 2011-03-29 | Discharge: 2011-03-29 | Disposition: A | Discharge status: Home / Self Care | Payer: Self-pay | Source: Ambulatory Visit | Attending: Obstetrics & Gynecology | Admitting: Obstetrics & Gynecology

## 2011-03-29 DIAGNOSIS — O923 Agalactia: Secondary | ICD-10-CM | POA: Insufficient documentation

## 2012-06-23 IMAGING — US US OB COMP LESS 14 WK
1 series · 13 of 28 positions shown · non-contrast
Comparison: None for this gestation
COMPARISON: None.

CLINICAL DATA: Pregnant, pain

OBSTETRIC <14 WK US AND TRANSVAGINAL OB US
TECHNIQUE: Both transabdominal and transvaginal ultrasound
examinations were performed for complete evaluation of the
gestation as well as the maternal uterus, adnexal regions, and
pelvic cul-de-sac.  Transvaginal technique was performed to assess
early pregnancy.  Additionally, duplex sonography of the the
ovaries was performed, reported below.
TECHNIQUE: Color and duplex Doppler ultrasound was utilized to
evaluate blood flow to the ovaries.

[Series 1: us ob comp less 14 wk · 0.18mm/px · 66 acquisitions, 13 frames shown]
[im 3/66]
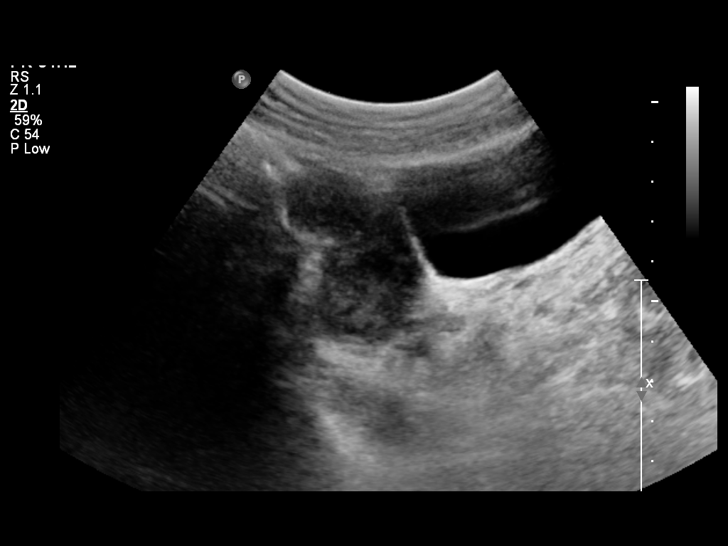
[im 8/66]
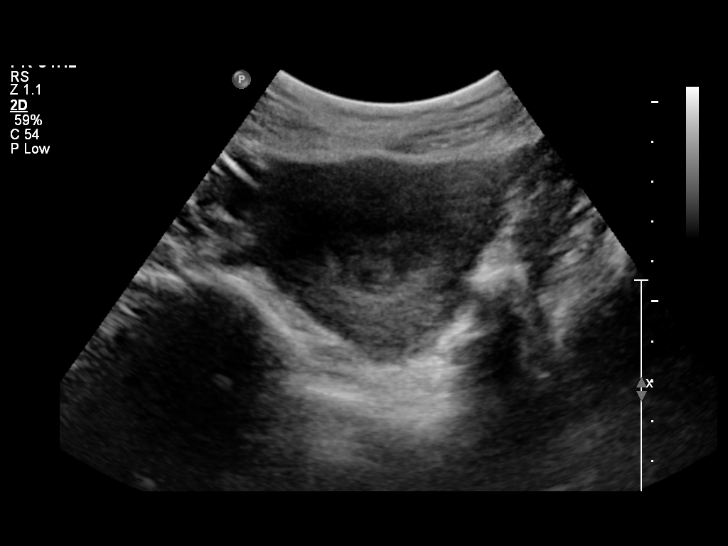
[im 13/66]
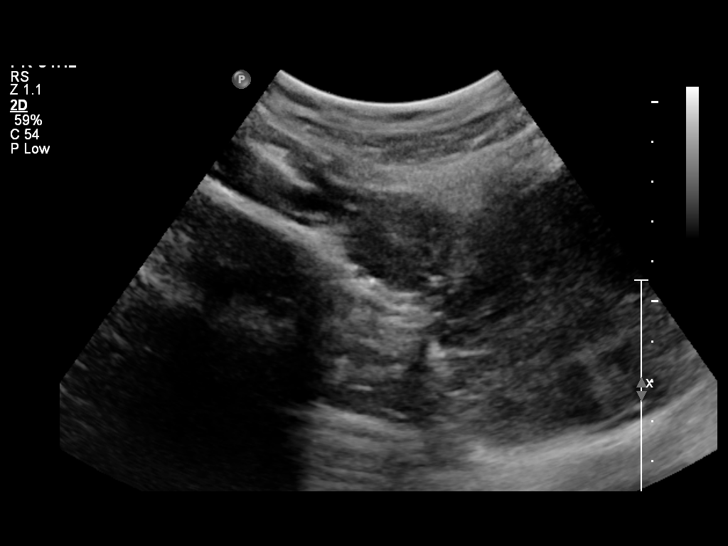
[im 17/66]
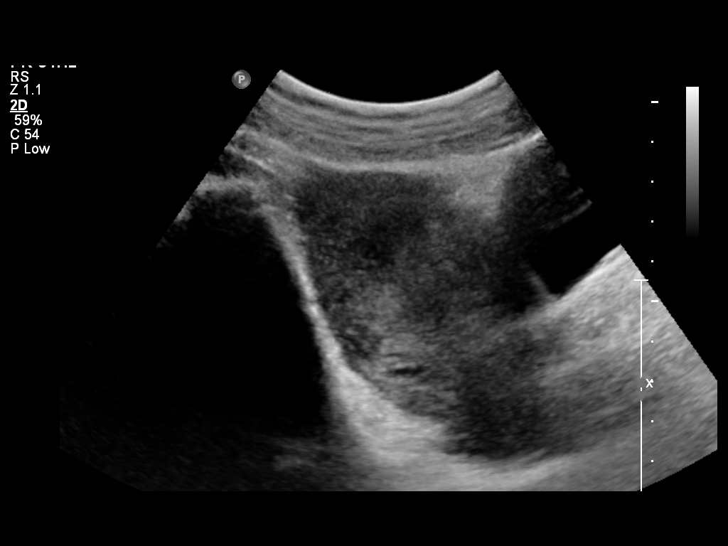
[im 22/66]
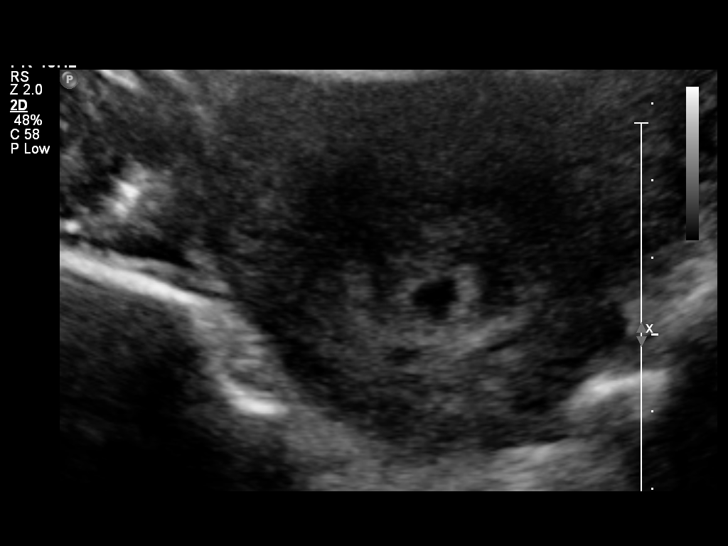
[im 27/66]
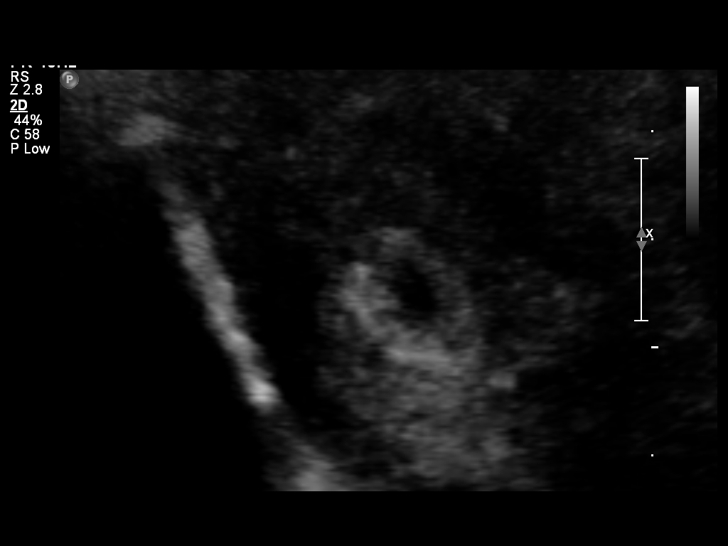
[im 34/66]
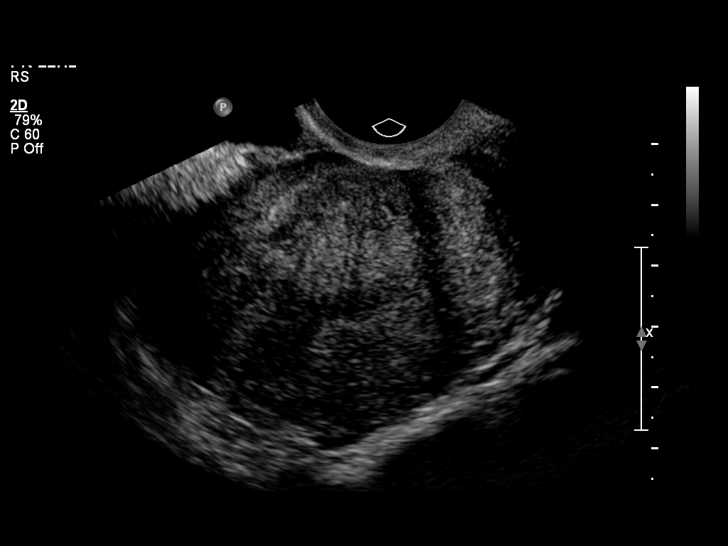
[im 39/66]
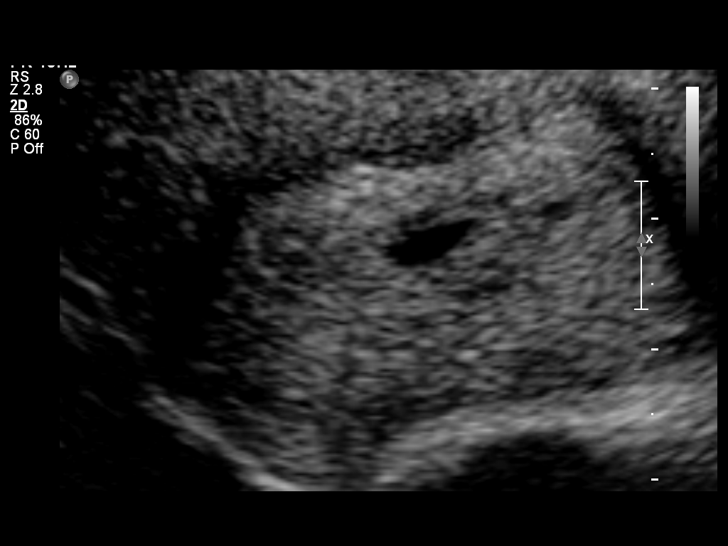
[im 44/66]
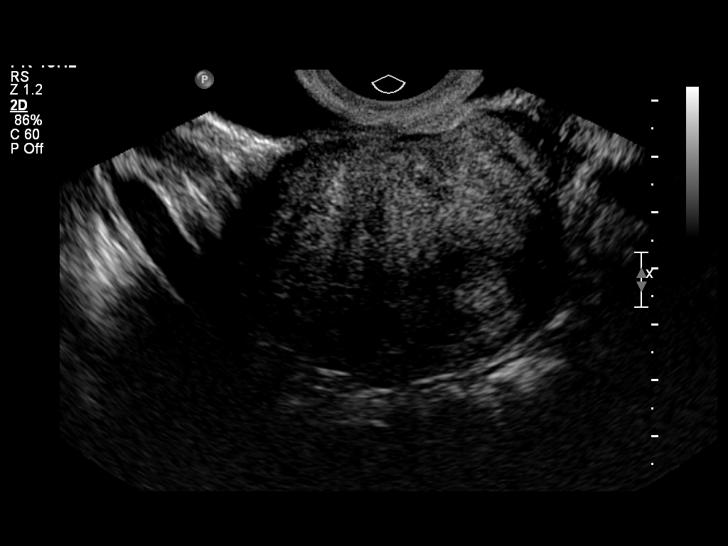
[im 49/66]
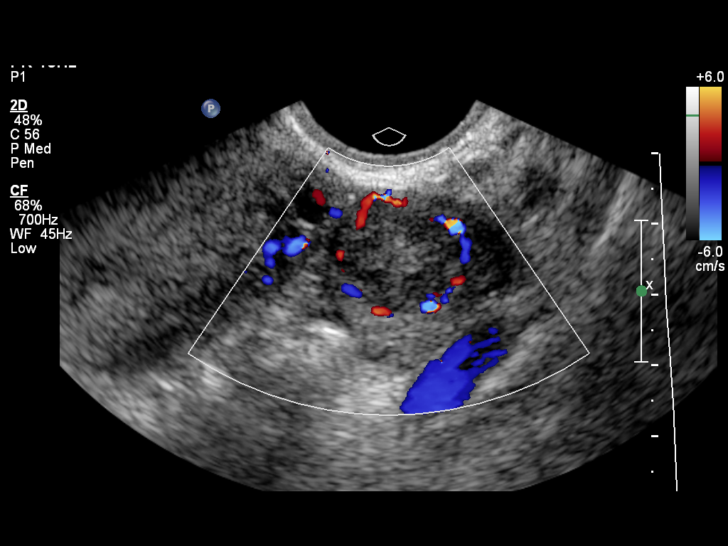
[im 53/66]
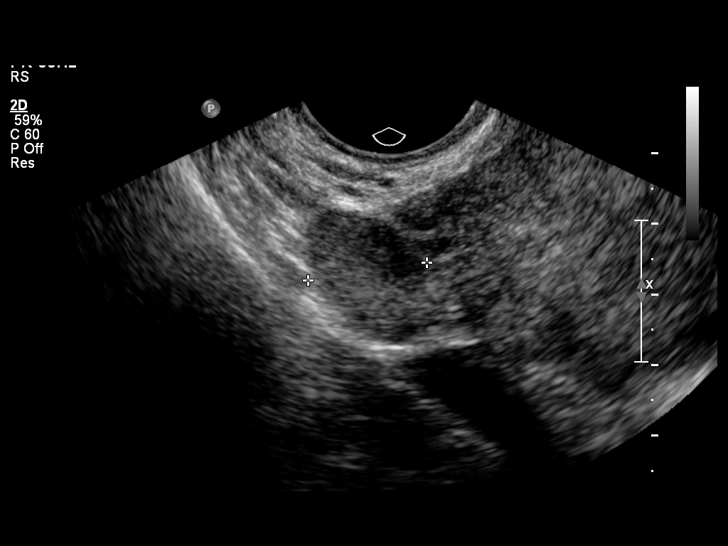
[im 58/66]
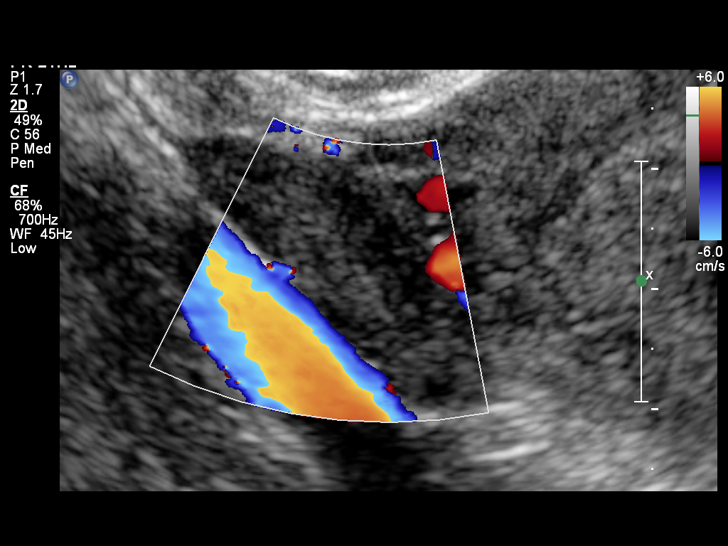
[im 63/66]
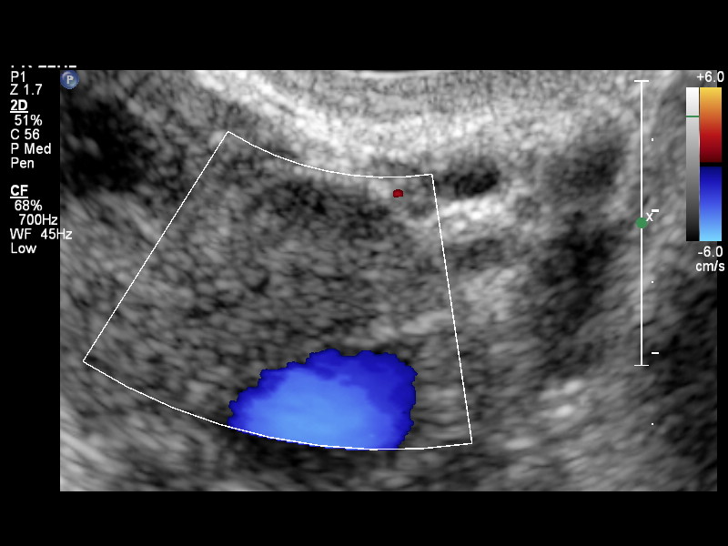

[13 of 28 positions shown; findings below may reference images not displayed]

Intrauterine gestational sac:  Present
Yolk sac: Not identified
Embryo: Not identified
Cardiac Activity: Not identified
Heart Rate: N/A bpm

MSD: 6  mm     5 w    1 d

Maternal uterus/adnexae:  Small subchorionic hemorrhage.
Left ovary measures 3.9 x 2.5 x 2.5 cm contains a small hemorrhagic
corpus luteum.
Right ovary measures 1.7 x 2.1 x 1.5 cm and is normal in
morphology.
No free pelvic fluid or additional adnexal masses.
IMPRESSION: Tiny gestational sac within uterus, mean sac diameter corresponding
to 5 weeks 1 day EGA.
Fetal pole is not yet identified to establish fetal viability;
recommend follow-up sonographic assessment in the 10 14 days to
establish viability.
Small subchorionic hemorrhage present.
Please refer to Doppler ultrasound report below concerning right
ovary.

DOPPLER ULTRASOUND OF OVARIES
FINDINGS: On color Doppler imaging, blood flow is identified within the left
ovary, including surrounding the hemorrhagic corpus luteum.
However,  no blood flow is identified within the right ovary on
color Doppler imaging despite prolonged imaging.
On duplex imaging, no arterial or venous waveforms are detected
within right ovary.

No right ovarian mass or additional right adnexal mass is
identified.
Cine loops obtained through the right ovary during color Doppler
imaging fail to demonstrate presence of blood flow within the right
ovary.
This appears to represent the right ovary, tissue containing what
appear to be small follicles on gray scale imaging.
IMPRESSION: Blood flow present within left ovary.
Unable to establish presence of blood flow within the right ovary
on color Doppler imaging or pulse Doppler imaging despite prolonged
search.
This is felt to represent the right ovary rather than tissue of
other etiology, appearing to contain follicles, and no other right
pelvic soft tissue mass/nodule is seen.
Unable to exclude right ovarian torsion with this appearance,
though the ovary lacks the typical ovarian enlargement as well as
an underlying predisposing abnormality often seen with ovarian
torsion.

Critical test results telephoned to Eskenazi NP in [facility] at the time of
interpretation on 01/21/2010 at 4555 hours.

## 2012-07-29 ENCOUNTER — Inpatient Hospital Stay (HOSPITAL_COMMUNITY): Payer: Self-pay

## 2012-07-29 ENCOUNTER — Inpatient Hospital Stay (HOSPITAL_COMMUNITY)
Admission: AD | Admit: 2012-07-29 | Discharge: 2012-07-29 | Disposition: A | Discharge status: Home / Self Care | Payer: Self-pay | Source: Ambulatory Visit | Attending: Family Medicine | Admitting: Family Medicine

## 2012-07-29 ENCOUNTER — Encounter (HOSPITAL_COMMUNITY): Payer: Self-pay | Admitting: *Deleted

## 2012-07-29 DIAGNOSIS — O00109 Unspecified tubal pregnancy without intrauterine pregnancy: Secondary | ICD-10-CM | POA: Insufficient documentation

## 2012-07-29 DIAGNOSIS — R1031 Right lower quadrant pain: Secondary | ICD-10-CM | POA: Insufficient documentation

## 2012-07-29 DIAGNOSIS — O009 Unspecified ectopic pregnancy without intrauterine pregnancy: Secondary | ICD-10-CM

## 2012-07-29 LAB — DIFFERENTIAL
Basophils Absolute: 0.1 10*3/uL (ref 0.0–0.1)
Eosinophils Relative: 3 % (ref 0–5)
Lymphocytes Relative: 32 % (ref 12–46)
Lymphs Abs: 2.9 10*3/uL (ref 0.7–4.0)
Neutrophils Relative %: 59 % (ref 43–77)

## 2012-07-29 LAB — COMPREHENSIVE METABOLIC PANEL
ALT: 12 U/L (ref 0–35)
AST: 15 U/L (ref 0–37)
Albumin: 3.7 g/dL (ref 3.5–5.2)
Alkaline Phosphatase: 57 U/L (ref 39–117)
CO2: 22 mEq/L (ref 19–32)
Chloride: 103 mEq/L (ref 96–112)
GFR calc non Af Amer: 75 mL/min — ABNORMAL LOW (ref >90)
Potassium: 4.1 mEq/L (ref 3.5–5.1)
Sodium: 135 mEq/L (ref 135–145)
Total Bilirubin: 0.2 mg/dL — ABNORMAL LOW (ref 0.3–1.2)

## 2012-07-29 LAB — CBC
Hemoglobin: 12.8 g/dL (ref 12.0–15.0)
MCHC: 34.7 g/dL (ref 30.0–36.0)
Platelets: 269 10*3/uL (ref 150–400)
RBC: 4.27 MIL/uL (ref 3.87–5.11)

## 2012-07-29 LAB — WET PREP, GENITAL: Trich, Wet Prep: NONE SEEN

## 2012-07-29 LAB — HCG, QUANTITATIVE, PREGNANCY: hCG, Beta Chain, Quant, S: 550 m[IU]/mL — ABNORMAL HIGH (ref <5)

## 2012-07-29 MED ORDER — PROMETHAZINE HCL 25 MG PO TABS: 25.0000 mg | ORAL_TABLET | Freq: Four times a day (QID) | ORAL | Status: AC | PRN

## 2012-07-29 MED ORDER — OXYCODONE-ACETAMINOPHEN 5-325 MG PO TABS: 2.0000 | ORAL_TABLET | ORAL | Status: AC | PRN

## 2012-07-29 MED ORDER — ONDANSETRON 8 MG PO TBDP: 8.0000 mg | ORAL_TABLET | Freq: Three times a day (TID) | ORAL | Status: AC | PRN

## 2012-07-29 MED ORDER — SODIUM CHLORIDE 0.9 % IV SOLN
INTRAVENOUS | Status: DC
  Administered 2012-07-29: 18:00:00 via INTRAVENOUS

## 2012-07-29 MED ORDER — METHOTREXATE INJECTION FOR WOMEN'S HOSPITAL
50.0000 mg/m2 | Freq: Once | INTRAMUSCULAR | Status: AC
  Administered 2012-07-29: 70 mg via INTRAMUSCULAR
  Filled 2012-07-29: qty 1.4

## 2012-07-29 MED ORDER — OXYCODONE-ACETAMINOPHEN 5-325 MG PO TABS
2.0000 | ORAL_TABLET | Freq: Once | ORAL | Status: AC
  Administered 2012-07-29: 2 via ORAL
  Filled 2012-07-29: qty 2

## 2012-07-29 MED ORDER — ONDANSETRON HCL 4 MG/2ML IJ SOLN
4.0000 mg | Freq: Once | INTRAMUSCULAR | Status: AC
  Administered 2012-07-29: 4 mg via INTRAVENOUS
  Filled 2012-07-29: qty 2

## 2012-07-29 NOTE — MAU Note (Signed)
Pain started last night but has gottten worse, feels like "parts are open", pain in hips. Hx of ectopic, required surgery.

## 2012-07-29 NOTE — MAU Provider Note (Signed)
History     CSN: [id number]  Arrival date and time: 07/29/12 1446   First Provider Initiated Contact with Patient 07/29/12 1506      Chief Complaint  Patient presents with  . Abdominal Pain  . Possible Pregnancy   HPI  Pt is ~[redacted]weeks pregnant with hx of 2 previous ectopic pregnancies presenting with acute lower abdominal pain onset about 1 hour prior to admission.  Pt had a positive UPT 2 weeks ago.  Pt's pain started RLQ and now is diffuse lower abd pain.  Pt denies nausea, vomiting or UTI symptoms.  Pt has had small amount of spotting.  Pt last ate at 12:30pm- taco.   Past Medical History  Diagnosis Date  . No pertinent past medical history   . Anemia   . Ovarian cyst   . Depression   . Abnormal Pap smear     Past Surgical History  Procedure Laterality Date  . Ovarian cyst removal    . Exploratory laparotomy      ovarian    Family History  Problem Relation Age of Onset  . Hypertension Mother   . Hyperlipidemia Mother   . Diabetes Father     History  Substance Use Topics  . Smoking status: Never Smoker   . Smokeless tobacco: Not on file  . Alcohol Use: No    Allergies: No Known Allergies  Prescriptions prior to admission  Medication Sig Dispense Refill  . folic acid (FOLVITE) 1 MG tablet Take 1 mg by mouth daily.        . prenatal vitamin w/FE, FA (PRENATAL 1 + 1) 27-1 MG TABS Take 1 tablet by mouth daily.          Review of Systems  Constitutional: Negative for fever and chills.  Gastrointestinal: Positive for nausea and abdominal pain. Negative for diarrhea and constipation.  Genitourinary: Negative for dysuria, urgency and frequency.   Physical Exam   Blood pressure 107/65, pulse 79, temperature 98.1 F (36.7 C), temperature source Oral, resp. rate 20, height 4' 9.25" (1.454 m), weight 49.351 kg (108 lb 12.8 oz), last menstrual period 06/18/2012, SpO2 100.00%.  Physical Exam  Nursing note and vitals reviewed. Constitutional: She is oriented to  person, place, and time. She appears well-developed and well-nourished.  Pt in much pain  HENT:  Head: Normocephalic.  Eyes: Pupils are equal, round, and reactive to light.  Neck: Normal range of motion. Neck supple.  Cardiovascular: Normal rate.   Respiratory: Effort normal.  GI: Soft. She exhibits no distension. There is tenderness. There is no rebound.  Genitourinary:  Vagina clean; mod amount of white mucous d/c from os; uterus mildly tender with right adnexal tenderness  Musculoskeletal: Normal range of motion.  Neurological: She is alert and oriented to person, place, and time.  Skin: Skin is warm and dry.  Psychiatric: She has a normal mood and affect.    MAU Course  Procedures  Call from Dr. Eppie Gibson reporting suspicious right ectopic pregnancy Discussed with Dr. Shawnie Pons- if pt does not have an acute abdomen- pt is MTX candidate Note- pt has had MTX with previous ectopic pregnancy- pt is agreeable to repeat again Discussed instructions and precautions- stop any MVT and no Ibuprofen;  Pt to return if increase in pain or bleeding Percocet 2 tabs given for the pain and Zofran 4mg  IV with relief of nausea Results for orders placed during the hospital encounter of 07/29/12 (from the past 24 hour(s))  COMPREHENSIVE METABOLIC PANEL  Status: Abnormal   Collection Time    07/29/12  3:09 PM      Result Value Range   Sodium 135  135 - 145 mEq/L   Potassium 4.1  3.5 - 5.1 mEq/L   Chloride 103  96 - 112 mEq/L   CO2 22  19 - 32 mEq/L   Glucose, Bld 90  70 - 99 mg/dL   BUN 12  6 - 23 mg/dL   Creatinine, Ser 1.61  0.50 - 1.10 mg/dL   Calcium 9.3  8.4 - 09.6 mg/dL   Total Protein 7.1  6.0 - 8.3 g/dL   Albumin 3.7  3.5 - 5.2 g/dL   AST 15  0 - 37 U/L   ALT 12  0 - 35 U/L   Alkaline Phosphatase 57  39 - 117 U/L   Total Bilirubin 0.2 (*) 0.3 - 1.2 mg/dL   GFR calc non Af Amer 75 (*) >90 mL/min   GFR calc Af Amer 87 (*) >90 mL/min  CBC     Status: None   Collection Time    07/29/12   3:25 PM      Result Value Range   WBC 9.0  4.0 - 10.5 K/uL   RBC 4.27  3.87 - 5.11 MIL/uL   Hemoglobin 12.8  12.0 - 15.0 g/dL   HCT 04.5  40.9 - 81.1 %   MCV 86.4  78.0 - 100.0 fL   MCH 30.0  26.0 - 34.0 pg   MCHC 34.7  30.0 - 36.0 g/dL   RDW 91.4  78.2 - 95.6 %   Platelets 269  150 - 400 K/uL  HCG, QUANTITATIVE, PREGNANCY     Status: Abnormal   Collection Time    07/29/12  3:25 PM      Result Value Range   hCG, Beta Chain, Quant, S 550 (*) <5 mIU/mL  DIFFERENTIAL     Status: None   Collection Time    07/29/12  3:25 PM      Result Value Range   Neutrophils Relative % 59  43 - 77 %   Neutro Abs 5.4  1.7 - 7.7 K/uL   Lymphocytes Relative 32  12 - 46 %   Lymphs Abs 2.9  0.7 - 4.0 K/uL   Monocytes Relative 6  3 - 12 %   Monocytes Absolute 0.6  0.1 - 1.0 K/uL   Eosinophils Relative 3  0 - 5 %   Eosinophils Absolute 0.3  0.0 - 0.7 K/uL   Basophils Relative 1  0 - 1 %   Basophils Absolute 0.1  0.0 - 0.1 K/uL  WET PREP, GENITAL     Status: Abnormal   Collection Time    07/29/12  5:05 PM      Result Value Range   Yeast Wet Prep HPF POC NONE SEEN  NONE SEEN   Trich, Wet Prep NONE SEEN  NONE SEEN   Clue Cells Wet Prep HPF POC NONE SEEN  NONE SEEN   WBC, Wet Prep HPF POC MANY (*) NONE SEEN   Assessment and Plan  Right ectopic pregnancy- MTX given Return on Sunday for repeat HCG- Day 4 (08/01/2012) Percocet RX for pain  Deniro Laymon 07/29/2012, 3:20 PM

## 2012-07-29 NOTE — MAU Note (Signed)
Sharp stabbing pain started within last hour.  No bleeding. +HPT ~ 2 wks ago

## 2012-07-29 NOTE — Progress Notes (Signed)
intially pt states that pain is on the right side. Pt now states pain is in lower pelvic area and pt shows the whole lower abdominal area

## 2012-07-29 NOTE — MAU Note (Signed)
Pt given discharge instructions with Interpreter at bedside. Pt understands that she needs to return to MAU for follow up

## 2012-07-30 LAB — GC/CHLAMYDIA PROBE AMP
CT Probe RNA: NEGATIVE
GC Probe RNA: NEGATIVE

## 2012-07-30 NOTE — MAU Provider Note (Signed)
Chart reviewed and agree with management and plan.  

## 2012-07-31 ENCOUNTER — Inpatient Hospital Stay (HOSPITAL_COMMUNITY)
Admission: AD | Admit: 2012-07-31 | Discharge: 2012-08-01 | Disposition: A | Discharge status: Home / Self Care | Payer: Self-pay | Source: Ambulatory Visit | Attending: Obstetrics and Gynecology | Admitting: Obstetrics and Gynecology

## 2012-07-31 DIAGNOSIS — N949 Unspecified condition associated with female genital organs and menstrual cycle: Secondary | ICD-10-CM | POA: Insufficient documentation

## 2012-07-31 DIAGNOSIS — O009 Unspecified ectopic pregnancy without intrauterine pregnancy: Secondary | ICD-10-CM

## 2012-07-31 DIAGNOSIS — O99891 Other specified diseases and conditions complicating pregnancy: Secondary | ICD-10-CM | POA: Insufficient documentation

## 2012-07-31 DIAGNOSIS — R109 Unspecified abdominal pain: Secondary | ICD-10-CM | POA: Insufficient documentation

## 2012-07-31 LAB — CBC
Hemoglobin: 12.4 g/dL (ref 12.0–15.0)
MCH: 30 pg (ref 26.0–34.0)
MCHC: 34.5 g/dL (ref 30.0–36.0)
Platelets: 229 10*3/uL (ref 150–400)
RBC: 4.13 MIL/uL (ref 3.87–5.11)

## 2012-07-31 LAB — URINALYSIS, ROUTINE W REFLEX MICROSCOPIC
Glucose, UA: NEGATIVE mg/dL
Ketones, ur: NEGATIVE mg/dL
Leukocytes, UA: NEGATIVE
Nitrite: NEGATIVE
Protein, ur: NEGATIVE mg/dL
pH: 6.5 (ref 5.0–8.0)

## 2012-07-31 LAB — HCG, QUANTITATIVE, PREGNANCY: hCG, Beta Chain, Quant, S: 1065 m[IU]/mL — ABNORMAL HIGH (ref <5)

## 2012-07-31 NOTE — MAU Note (Signed)
Pt received MTX on 07/29/12 for an ectopic- today at 8pm, she state she had a sudden onset of increased pain on the left lower abd

## 2012-08-01 ENCOUNTER — Inpatient Hospital Stay (HOSPITAL_COMMUNITY): Payer: Self-pay

## 2012-08-01 DIAGNOSIS — O009 Unspecified ectopic pregnancy without intrauterine pregnancy: Secondary | ICD-10-CM

## 2012-08-01 MED ORDER — HYDROMORPHONE HCL PF 2 MG/ML IJ SOLN
2.0000 mg | Freq: Once | INTRAMUSCULAR | Status: AC
  Administered 2012-08-01: 2 mg via INTRAMUSCULAR
  Filled 2012-08-01: qty 1

## 2012-08-01 NOTE — MAU Provider Note (Signed)
Attestation of Attending Supervision of Advanced Practitioner (CNM/NP): Evaluation and management procedures were performed by the Advanced Practitioner under my supervision and collaboration.  I have reviewed the Advanced Practitioner's note and chart, and I agree with the management and plan.  Reather Steller 08/01/2012 7:45 AM

## 2012-08-01 NOTE — MAU Provider Note (Signed)
History     CSN: [id number]  Arrival date and time: 07/31/12 2259   First Provider Initiated Contact with Patient 08/01/12 0017      Chief Complaint  Patient presents with  . Abdominal Pain   HPI Pt is a [id number] here s/p MTX on 07/29/12 after diagnosis of a 11mm right adnexal mass.  No report of bleeding at that time.  Here tonight with report of increasing pain.  No vaginal bleeding.   Past Medical History  Diagnosis Date  . No pertinent past medical history   . Anemia   . Ovarian cyst   . Depression   . Abnormal Pap smear     Past Surgical History  Procedure Laterality Date  . Ovarian cyst removal    . Exploratory laparotomy      ovarian    Family History  Problem Relation Age of Onset  . Hypertension Mother   . Hyperlipidemia Mother   . Diabetes Father     History  Substance Use Topics  . Smoking status: Never Smoker   . Smokeless tobacco: Not on file  . Alcohol Use: No    Allergies: No Known Allergies  Prescriptions prior to admission  Medication Sig Dispense Refill  . oxyCODONE-acetaminophen (PERCOCET/ROXICET) 5-325 MG per tablet Take 2 tablets by mouth every 4 (four) hours as needed for pain.  15 tablet  0  . promethazine (PHENERGAN) 25 MG tablet Take 1 tablet (25 mg total) by mouth every 6 (six) hours as needed for nausea.  12 tablet  0  . ondansetron (ZOFRAN ODT) 8 MG disintegrating tablet Take 1 tablet (8 mg total) by mouth every 8 (eight) hours as needed for nausea.  12 tablet  0    Review of Systems  Gastrointestinal: Positive for abdominal pain (pelvic pain). Negative for nausea and vomiting.  Genitourinary: Negative.   All other systems reviewed and are negative.   Physical Exam   Blood pressure 103/67, pulse 71, temperature 97.8 F (36.6 C), temperature source Oral, resp. rate 16, height 4\' 10"  (1.473 m), weight 47.174 kg (104 lb), last menstrual period 06/18/2012, SpO2 100.00%.  Physical Exam  Constitutional: She is oriented to  person, place, and time. She appears well-developed and well-nourished. No distress.  Appears uncomfortable  HENT:  Head: Normocephalic.  Neck: Normal range of motion. Neck supple.  Cardiovascular: Normal rate, regular rhythm and normal heart sounds.   Respiratory: Effort normal and breath sounds normal. No respiratory distress.  GI: Soft. There is tenderness (left-side).  Genitourinary: Right adnexum displays no tenderness. Left adnexum displays tenderness. No bleeding around the vagina.  Musculoskeletal: Normal range of motion.  Neurological: She is alert and oriented to person, place, and time.  Skin: Skin is warm and dry.    MAU Course  Procedures  Results for SAIRA, KRAMME (MRN [id number]) as of 08/01/2012 00:23  Ref. Range 07/29/2012 15:09 07/29/2012 15:25 07/29/2012 16:28 07/29/2012 17:05 07/30/2012 09:25 07/31/2012 01:10 07/31/2012 23:10 07/31/2012 23:25  hCG, Beta Chain, Quant, S Latest Range: <5 mIU/mL  550 (H)      1065 (H)   Results for orders placed during the hospital encounter of 07/31/12 (from the past 24 hour(s))  URINALYSIS, ROUTINE W REFLEX MICROSCOPIC     Status: None   Collection Time    07/31/12 11:10 PM      Result Value Range   Color, Urine YELLOW  YELLOW   APPearance CLEAR  CLEAR   Specific Gravity, Urine 1.025  1.005 - 1.030  pH 6.5  5.0 - 8.0   Glucose, UA NEGATIVE  NEGATIVE mg/dL   Hgb urine dipstick NEGATIVE  NEGATIVE   Bilirubin Urine NEGATIVE  NEGATIVE   Ketones, ur NEGATIVE  NEGATIVE mg/dL   Protein, ur NEGATIVE  NEGATIVE mg/dL   Urobilinogen, UA 0.2  0.0 - 1.0 mg/dL   Nitrite NEGATIVE  NEGATIVE   Leukocytes, UA NEGATIVE  NEGATIVE  HCG, QUANTITATIVE, PREGNANCY     Status: Abnormal   Collection Time    07/31/12 11:25 PM      Result Value Range   hCG, Beta Chain, Quant, S 1065 (*) <5 mIU/mL  CBC     Status: Abnormal   Collection Time    07/31/12 11:25 PM      Result Value Range   WBC 9.2  4.0 - 10.5 K/uL   RBC 4.13  3.87 - 5.11 MIL/uL    Hemoglobin 12.4  12.0 - 15.0 g/dL   HCT 16.1 (*) 09.6 - 04.5 %   MCV 86.9  78.0 - 100.0 fL   MCH 30.0  26.0 - 34.0 pg   MCHC 34.5  30.0 - 36.0 g/dL   RDW 40.9  81.1 - 91.4 %   Platelets 229  150 - 400 K/uL   Ultrasound: IMPRESSION:  The previously seen probable right adnexal ectopic pregnancy is no  longer visualized. No acute abnormality.  Consulted with Dr. Jolayne Panther > reviewed HPI/exam/labs/ultrasound > agrees with plan to return for repeat BHCG on Wednesday.   Assessment and Plan  Pelvic Pain in Pregnancy  Plan: DC to home Repeat BHCG on Wednesday, June 18th. Report increasing pain.    [facility] 08/01/2012, 12:23 AM

## 2012-08-04 ENCOUNTER — Inpatient Hospital Stay (HOSPITAL_COMMUNITY): Payer: Self-pay

## 2012-08-04 ENCOUNTER — Encounter (HOSPITAL_COMMUNITY): Payer: Self-pay

## 2012-08-04 ENCOUNTER — Inpatient Hospital Stay (HOSPITAL_COMMUNITY)
Admission: AD | Admit: 2012-08-04 | Discharge: 2012-08-04 | Disposition: A | Discharge status: Home / Self Care | Payer: Self-pay | Source: Ambulatory Visit | Attending: Obstetrics and Gynecology | Admitting: Obstetrics and Gynecology

## 2012-08-04 DIAGNOSIS — O00109 Unspecified tubal pregnancy without intrauterine pregnancy: Secondary | ICD-10-CM | POA: Insufficient documentation

## 2012-08-04 DIAGNOSIS — O3680X0 Pregnancy with inconclusive fetal viability, not applicable or unspecified: Secondary | ICD-10-CM

## 2012-08-04 DIAGNOSIS — Z3200 Encounter for pregnancy test, result unknown: Secondary | ICD-10-CM

## 2012-08-04 LAB — COMPREHENSIVE METABOLIC PANEL
ALT: 14 U/L (ref 0–35)
AST: 18 U/L (ref 0–37)
Albumin: 3.7 g/dL (ref 3.5–5.2)
Alkaline Phosphatase: 60 U/L (ref 39–117)
Chloride: 105 mEq/L (ref 96–112)
Potassium: 3.7 mEq/L (ref 3.5–5.1)
Sodium: 137 mEq/L (ref 135–145)
Total Bilirubin: 0.2 mg/dL — ABNORMAL LOW (ref 0.3–1.2)
Total Protein: 7 g/dL (ref 6.0–8.3)

## 2012-08-04 LAB — CBC
MCHC: 35.1 g/dL (ref 30.0–36.0)
Platelets: 246 10*3/uL (ref 150–400)
RDW: 13.7 % (ref 11.5–15.5)
WBC: 10.1 10*3/uL (ref 4.0–10.5)

## 2012-08-04 LAB — HCG, QUANTITATIVE, PREGNANCY: hCG, Beta Chain, Quant, S: 1539 m[IU]/mL — ABNORMAL HIGH (ref <5)

## 2012-08-04 NOTE — MAU Note (Signed)
Pt states via Eda, spanish translator, no pain or bleeding, here for repeat BHCG post MTX only.

## 2012-08-04 NOTE — MAU Provider Note (Signed)
Marissa Little is a 32 y.o. [id number] at Unknown here for follow up quant HCG on day 7 s/p MTX. Vaginal bleeding: none. Abdominal pain: none.    6/12 - HCG 550, u/s showed 11 mm right adnexal mass suspicious for ectopic, MTX given 6/14 - HCG 1065, increased pain, u/s repeated, adnexal mass no longer seen    Past Medical History  Diagnosis Date  . No pertinent past medical history   . Anemia   . Ovarian cyst   . Depression   . Abnormal Pap smear     No prescriptions prior to admission    No Known Allergies  Review of Systems - Negative pain and bleeding  Objective BP 103/63  Pulse 67  Temp(Src) 98.7 F (37.1 C) (Oral)  Resp 16  Ht 4\' 10"  (1.473 m)  LMP 06/18/2012  General: Alert, oriented, no acute distress  Results for orders placed during the hospital encounter of 08/04/12 (from the past 24 hour(s))  CBC     Status: Abnormal   Collection Time    08/04/12  7:30 AM      Result Value Range   WBC 10.1  4.0 - 10.5 K/uL   RBC 4.09  3.87 - 5.11 MIL/uL   Hemoglobin 12.5  12.0 - 15.0 g/dL   HCT 56.2 (*) 13.0 - 86.5 %   MCV 87.0  78.0 - 100.0 fL   MCH 30.6  26.0 - 34.0 pg   MCHC 35.1  30.0 - 36.0 g/dL   RDW 78.4  69.6 - 29.5 %   Platelets 246  150 - 400 K/uL  COMPREHENSIVE METABOLIC PANEL     Status: Abnormal   Collection Time    08/04/12  7:30 AM      Result Value Range   Sodium 137  135 - 145 mEq/L   Potassium 3.7  3.5 - 5.1 mEq/L   Chloride 105  96 - 112 mEq/L   CO2 23  19 - 32 mEq/L   Glucose, Bld 90  70 - 99 mg/dL   BUN 12  6 - 23 mg/dL   Creatinine, Ser 2.84  0.50 - 1.10 mg/dL   Calcium 8.2 (*) 8.4 - 10.5 mg/dL   Total Protein 7.0  6.0 - 8.3 g/dL   Albumin 3.7  3.5 - 5.2 g/dL   AST 18  0 - 37 U/L   ALT 14  0 - 35 U/L   Alkaline Phosphatase 60  39 - 117 U/L   Total Bilirubin 0.2 (*) 0.3 - 1.2 mg/dL   GFR calc non Af Amer >90  >90 mL/min   GFR calc Af Amer >90  >90 mL/min  HCG, QUANTITATIVE, PREGNANCY     Status: Abnormal   Collection Time   08/04/12  7:48 AM      Result Value Range   hCG, Beta Chain, Quant, S 1539 (*) <5 mIU/mL     Assessment  1. Pregnancy of unknown anatomic location     Plan Repeat quant HCG in 2 days, u/s or repeat MTX as indicated, precautions carefully rev'd with patient     Medication List    TAKE these medications       ondansetron 8 MG disintegrating tablet  Commonly known as:  ZOFRAN ODT  Take 1 tablet (8 mg total) by mouth every 8 (eight) hours as needed for nausea.     oxyCODONE-acetaminophen 5-325 MG per tablet  Commonly known as:  PERCOCET/ROXICET  Take 2 tablets by mouth every 4 (four) hours  as needed for pain.     promethazine 25 MG tablet  Commonly known as:  PHENERGAN  Take 1 tablet (25 mg total) by mouth every 6 (six) hours as needed for nausea.        Follow-up Information   Follow up with THE [facility] [place] MATERNITY ADMISSIONS On 08/06/2012. (for labs)    Contact information:   [address] [id number] [place] [address] [phone]      [facility] 5:55 PM

## 2012-08-06 ENCOUNTER — Encounter (HOSPITAL_COMMUNITY): Payer: Self-pay | Admitting: *Deleted

## 2012-08-06 ENCOUNTER — Inpatient Hospital Stay (HOSPITAL_COMMUNITY)
Admission: AD | Admit: 2012-08-06 | Discharge: 2012-08-06 | Disposition: A | Discharge status: Home / Self Care | Payer: Self-pay | Source: Ambulatory Visit | Attending: Obstetrics and Gynecology | Admitting: Obstetrics and Gynecology

## 2012-08-06 DIAGNOSIS — O00109 Unspecified tubal pregnancy without intrauterine pregnancy: Secondary | ICD-10-CM | POA: Insufficient documentation

## 2012-08-06 NOTE — MAU Provider Note (Signed)
  History     CSN: [id number]  Arrival date and time: 08/06/12 1191   None     Chief Complaint  Patient presents with  . Follow-up   HPI Marissa Little is 32 y.o. [id number] Unknown weeks presents for repeat BHCG.  She has been followed for pain in early pregnancy with BHCGs rising from 550 (6/12), 1065 (6/14) and 1539 (6/16).  Her ultrasounds are suspicious for ectopic, finding an 11mm right adnexal mass on 6/12.  Treated with MTX on that date.  On 6/14 u/s no longer saw the mass but on 6/16 report stated mass unchanged and possible gestational sac.   Blood type is O positive from previous record.  She denies pain and bleeding today.   Past Medical History  Diagnosis Date  . No pertinent past medical history   . Anemia   . Ovarian cyst   . Depression   . Abnormal Pap smear     Past Surgical History  Procedure Laterality Date  . Ovarian cyst removal    . Exploratory laparotomy      ovarian    Family History  Problem Relation Age of Onset  . Hypertension Mother   . Hyperlipidemia Mother   . Diabetes Father     History  Substance Use Topics  . Smoking status: Never Smoker   . Smokeless tobacco: Never Used  . Alcohol Use: No    Allergies: No Known Allergies  Prescriptions prior to admission  Medication Sig Dispense Refill  . ondansetron (ZOFRAN ODT) 8 MG disintegrating tablet Take 1 tablet (8 mg total) by mouth every 8 (eight) hours as needed for nausea.  12 tablet  0  . oxyCODONE-acetaminophen (PERCOCET/ROXICET) 5-325 MG per tablet Take 2 tablets by mouth every 4 (four) hours as needed for pain.  15 tablet  0  . promethazine (PHENERGAN) 25 MG tablet Take 1 tablet (25 mg total) by mouth every 6 (six) hours as needed for nausea.  12 tablet  0    ROS Physical Exam   Blood pressure 102/74, pulse 71, temperature 98.4 F (36.9 C), temperature source Oral, resp. rate 16, height 5\' 1"  (1.549 m), weight 107 lb 3.2 oz (48.626 kg), last menstrual period 06/18/2012, SpO2  100.00%.  Physical Exam    Results for orders placed during the hospital encounter of 08/06/12 (from the past 24 hour(s))  HCG, QUANTITATIVE, PREGNANCY     Status: Abnormal   Collection Time    08/06/12  7:00 AM      Result Value Range   hCG, Beta Chain, Quant, S 931 (*) <5 mIU/mL   MAU Course  Procedures  MDM   Assessment and Plan  A:  Continued follow up after methotrexate injection for ectopic pregnancy      Falling BHCG levels  P:  Follow up appt requested by Clinic for repeat BHCG in 1 week     Patient to return for increased pain or bleeding   Aleena Kirkeby,EVE M 08/06/2012, 8:09 AM

## 2012-08-06 NOTE — MAU Note (Signed)
Patient to MAU for follow up BHCG. Patient denies pain or bleeding.  

## 2012-08-08 NOTE — MAU Provider Note (Signed)
Attestation of Attending Supervision of Advanced Practitioner (CNM/NP): Evaluation and management procedures were performed by the Advanced Practitioner under my supervision and collaboration. I have reviewed the Advanced Practitioner's note and chart, and I agree with the management and plan.  Juliano Mceachin H. 7:17 PM   

## 2012-08-09 ENCOUNTER — Inpatient Hospital Stay (HOSPITAL_COMMUNITY)
Admission: AD | Admit: 2012-08-09 | Discharge: 2012-08-09 | Disposition: A | Discharge status: Home / Self Care | Payer: Self-pay | Source: Ambulatory Visit | Attending: Family Medicine | Admitting: Family Medicine

## 2012-08-09 DIAGNOSIS — O00109 Unspecified tubal pregnancy without intrauterine pregnancy: Secondary | ICD-10-CM | POA: Insufficient documentation

## 2012-08-09 DIAGNOSIS — O009 Unspecified ectopic pregnancy without intrauterine pregnancy: Secondary | ICD-10-CM

## 2012-08-09 LAB — CBC
HCT: 34.9 % — ABNORMAL LOW (ref 36.0–46.0)
Hemoglobin: 12.2 g/dL (ref 12.0–15.0)
MCV: 85.7 fL (ref 78.0–100.0)
RBC: 4.07 MIL/uL (ref 3.87–5.11)
WBC: 12.6 10*3/uL — ABNORMAL HIGH (ref 4.0–10.5)

## 2012-08-09 NOTE — MAU Provider Note (Signed)
Attestation of Attending Supervision of Advanced Practitioner (CNM/NP): Evaluation and management procedures were performed by the Advanced Practitioner under my supervision and collaboration.  I have reviewed the Advanced Practitioner's note and chart, and I agree with the management and plan.  Kymberley Raz 08/09/2012 12:08 PM

## 2012-08-09 NOTE — MAU Note (Signed)
Patient to MAU for BHCG s/p MTX. Patient denies bleeding or pain.

## 2012-08-12 ENCOUNTER — Inpatient Hospital Stay (HOSPITAL_COMMUNITY)
Admission: AD | Admit: 2012-08-12 | Discharge: 2012-08-12 | Disposition: A | Discharge status: Home / Self Care | Payer: Self-pay | Source: Ambulatory Visit | Attending: Obstetrics & Gynecology | Admitting: Obstetrics & Gynecology

## 2012-08-12 DIAGNOSIS — Z8759 Personal history of other complications of pregnancy, childbirth and the puerperium: Secondary | ICD-10-CM

## 2012-08-12 DIAGNOSIS — O00109 Unspecified tubal pregnancy without intrauterine pregnancy: Secondary | ICD-10-CM | POA: Insufficient documentation

## 2012-08-12 DIAGNOSIS — Z8742 Personal history of other diseases of the female genital tract: Secondary | ICD-10-CM

## 2012-08-12 NOTE — MAU Provider Note (Signed)
  History     CSN: [id number]  Arrival date and time: 08/12/12 4132   First Provider Initiated Contact with Patient 08/12/12 1030      No chief complaint on file.  HPI  Pt presents for f/u HCG Pt initially seen on 6/12 with suspected right ectopic and MTX given; HCG 550 On 6/14 HCG 1065 and increased pain; no adnexal mass on [place] On 6/18 HCG 1539 with ?IUP vs ectopic on [place] On 6/20 HCG 931 MTX repeated On 6/23 HCG 238 (Day 4) Today 6/15 HCG 168 small amount of bleeding   Past Medical History  Diagnosis Date  . No pertinent past medical history   . Anemia   . Ovarian cyst   . Depression   . Abnormal Pap smear     Past Surgical History  Procedure Laterality Date  . Ovarian cyst removal    . Exploratory laparotomy      ovarian    Family History  Problem Relation Age of Onset  . Hypertension Mother   . Hyperlipidemia Mother   . Diabetes Father     History  Substance Use Topics  . Smoking status: Never Smoker   . Smokeless tobacco: Never Used  . Alcohol Use: No    Allergies: No Known Allergies  Prescriptions prior to admission  Medication Sig Dispense Refill  . ondansetron (ZOFRAN ODT) 8 MG disintegrating tablet Take 1 tablet (8 mg total) by mouth every 8 (eight) hours as needed for nausea.  12 tablet  0  . oxyCODONE-acetaminophen (PERCOCET/ROXICET) 5-325 MG per tablet Take 2 tablets by mouth every 4 (four) hours as needed for pain.  15 tablet  0  . promethazine (PHENERGAN) 25 MG tablet Take 1 tablet (25 mg total) by mouth every 6 (six) hours as needed for nausea.  12 tablet  0    ROS Physical Exam   Last menstrual period 06/18/2012.  Physical Exam  Nursing note and vitals reviewed. Constitutional: She is oriented to person, place, and time. She appears well-developed and well-nourished.  HENT:  Head: Normocephalic.  Eyes: Pupils are equal, round, and reactive to light.  Neck: Normal range of motion. Neck supple.  Cardiovascular: Normal rate.    Respiratory: Effort normal.  Musculoskeletal: Normal range of motion.  Neurological: She is alert and oriented to person, place, and time.  Skin: Skin is warm and dry.  Psychiatric: She has a normal mood and affect.    MAU Course  Procedures Results for orders placed during the hospital encounter of 08/12/12 (from the past 24 hour(s))  HCG, QUANTITATIVE, PREGNANCY     Status: Abnormal   Collection Time    08/12/12  9:06 AM      Result Value Range   hCG, Beta Chain, Quant, S 168 (*) <5 mIU/mL    Assessment and Plan  Ectopic pregnancy with MTX x 2 Falling HCG Repeat HCG in 1 week Return sooner if increase in pain or bleeding  Johann Gascoigne 08/12/2012, 10:33 AM

## 2012-08-12 NOTE — MAU Note (Signed)
Feeling good, scant brownish bleeding yesterday.

## 2012-08-13 NOTE — MAU Provider Note (Signed)
Marissa Little is a 32 y.o. [id number] at Unknown here for follow up quant HCG on day 4 s/p second dose of MTX for ectopic pregnancy. Vaginal bleeding: none. Abdominal pain: none.    Pt initially seen on 6/12 with suspected right ectopic and MTX given; HCG 550  On 6/14 HCG 1065 and increased pain; no adnexal mass on [place]  On 6/18 HCG 1539 with ?IUP vs ectopic on [place]  On 6/20 HCG 931 MTX repeated   Past Medical History  Diagnosis Date  . No pertinent past medical history   . Anemia   . Ovarian cyst   . Depression   . Abnormal Pap smear     No prescriptions prior to admission    No Known Allergies    Objective BP 102/73  Pulse 80  Temp(Src) 98.4 F (36.9 C) (Oral)  Resp 16  SpO2 100%  LMP 06/18/2012  General: Alert, oriented, no acute distress  Results: Quant HCG 238  Assessment  1. Ectopic pregnancy     Plan Repeat HCG level on day 7, precautions rev'd      Medication List    TAKE these medications       ondansetron 8 MG disintegrating tablet  Commonly known as:  ZOFRAN ODT  Take 1 tablet (8 mg total) by mouth every 8 (eight) hours as needed for nausea.     oxyCODONE-acetaminophen 5-325 MG per tablet  Commonly known as:  PERCOCET/ROXICET  Take 2 tablets by mouth every 4 (four) hours as needed for pain.     promethazine 25 MG tablet  Commonly known as:  PHENERGAN  Take 1 tablet (25 mg total) by mouth every 6 (six) hours as needed for nausea.            Follow-up Information   Follow up with THE [facility] [place] MATERNITY ADMISSIONS On 08/12/2012.   Contact information:   [address] [id number] [place] [address] [phone]      Marissa Little 1:03 PM 08/13/12

## 2012-08-16 NOTE — MAU Provider Note (Signed)
Chart reviewed and agree with management and plan.  

## 2012-08-20 ENCOUNTER — Inpatient Hospital Stay (HOSPITAL_COMMUNITY)
Admission: AD | Admit: 2012-08-20 | Discharge: 2012-08-20 | Disposition: A | Discharge status: Home / Self Care | Payer: Self-pay | Source: Ambulatory Visit | Attending: Obstetrics & Gynecology | Admitting: Obstetrics & Gynecology

## 2012-08-20 DIAGNOSIS — O009 Unspecified ectopic pregnancy without intrauterine pregnancy: Secondary | ICD-10-CM

## 2012-08-20 DIAGNOSIS — O00109 Unspecified tubal pregnancy without intrauterine pregnancy: Secondary | ICD-10-CM | POA: Insufficient documentation

## 2012-08-20 LAB — HCG, QUANTITATIVE, PREGNANCY: hCG, Beta Chain, Quant, S: 4 m[IU]/mL (ref <5)

## 2012-08-20 NOTE — MAU Note (Signed)
Patient to MAU for weekly BHCG s/p MTX. Patient denies any bleeding or pain.

## 2012-08-20 NOTE — MAU Provider Note (Signed)
Attestation of Attending Supervision of Advanced Practitioner (CNM/NP): Evaluation and management procedures were performed by the Advanced Practitioner under my supervision and collaboration. I have reviewed the Advanced Practitioner's note and chart, and I agree with the management and plan.  Caiden Monsivais H. 12:41 PM   

## 2012-08-20 NOTE — MAU Provider Note (Signed)
History     CSN: [id number]  Arrival date and time: 08/20/12 0917   First Provider Initiated Contact with Patient 08/20/12 [phone]      Chief Complaint  Patient presents with  . Follow-up   HPI Marissa Little 32 y.o. Here for follow up quant after Methotrexate given for ectopic pregnancy on 07-29-12 and 08-06-12.  Is now getting weekly quants and levels have been dropping well.  On 08-09-12 quant was 238 and on 08-12-12 level was 168.  Client reports no problems.  Was having vaginal bleeding last week but it has stopped.  Denies any pain.    OB History   Grav Para Term Preterm Abortions TAB SAB Ect Mult Living   5 2 2  2   2  2       Past Medical History  Diagnosis Date  . No pertinent past medical history   . Anemia   . Ovarian cyst   . Depression   . Abnormal Pap smear     Past Surgical History  Procedure Laterality Date  . Ovarian cyst removal    . Exploratory laparotomy      ovarian    Family History  Problem Relation Age of Onset  . Hypertension Mother   . Hyperlipidemia Mother   . Diabetes Father     History  Substance Use Topics  . Smoking status: Never Smoker   . Smokeless tobacco: Never Used  . Alcohol Use: No    Allergies: No Known Allergies  Prescriptions prior to admission  Medication Sig Dispense Refill  . ondansetron (ZOFRAN ODT) 8 MG disintegrating tablet Take 1 tablet (8 mg total) by mouth every 8 (eight) hours as needed for nausea.  12 tablet  0  . oxyCODONE-acetaminophen (PERCOCET/ROXICET) 5-325 MG per tablet Take 2 tablets by mouth every 4 (four) hours as needed for pain.  15 tablet  0  . promethazine (PHENERGAN) 25 MG tablet Take 1 tablet (25 mg total) by mouth every 6 (six) hours as needed for nausea.  12 tablet  0    Review of Systems  Constitutional: Negative for fever.  Gastrointestinal: Negative for nausea, vomiting and abdominal pain.  Genitourinary:       No vaginal discharge. No vaginal bleeding. No dysuria.   Physical Exam    Blood pressure 100/53, pulse 70, temperature 98.3 F (36.8 C), temperature source Oral, resp. rate 16, last menstrual period 06/18/2012, SpO2 100.00%.  Physical Exam  Nursing note and vitals reviewed. Constitutional: She is oriented to person, place, and time. She appears well-developed and well-nourished.  HENT:  Head: Normocephalic.  Eyes: EOM are normal.  Neck: Neck supple.  GI: Soft. There is no tenderness. There is no rebound and no guarding.  Musculoskeletal: Normal range of motion.  Neurological: She is alert and oriented to person, place, and time.  Skin: Skin is warm and dry.  Psychiatric: She has a normal mood and affect.    MAU Course  Procedures Results for orders placed during the hospital encounter of 08/20/12 (from the past 24 hour(s))  HCG, QUANTITATIVE, PREGNANCY     Status: None   Collection Time    08/20/12  9:44 AM      Result Value Range   hCG, Beta Chain, Quant, S 4  <5 mIU/mL    MDM Client will go home.  Expect quant to be continuing to decrease.  Will call her if Marissa Little is rising.  Verified working phone number.  Client speaks English.  Discussed with client  and sent message to the clinic to have continued followup there.  Advised no sex until seen in the clinic.  Assessment and Plan  S/P Ectopic Pregnancy  Plan Will be followed up in GYN clinic. Message sent to clinic to call patient for an appointment.  Marissa Little 08/20/2012, 9:39 AM

## 2012-08-31 ENCOUNTER — Encounter: Payer: Self-pay | Admitting: Obstetrics & Gynecology

## 2012-10-20 ENCOUNTER — Encounter: Payer: Self-pay | Admitting: *Deleted

## 2013-12-19 ENCOUNTER — Encounter (HOSPITAL_COMMUNITY): Payer: Self-pay | Admitting: *Deleted

## 2013-12-24 ENCOUNTER — Encounter (HOSPITAL_COMMUNITY): Payer: Self-pay | Admitting: *Deleted

## 2013-12-24 ENCOUNTER — Inpatient Hospital Stay (HOSPITAL_COMMUNITY)
Admission: AD | Admit: 2013-12-24 | Discharge: 2013-12-24 | Disposition: A | Discharge status: Home / Self Care | Payer: Self-pay | Source: Ambulatory Visit | Attending: Obstetrics and Gynecology | Admitting: Obstetrics and Gynecology

## 2013-12-24 ENCOUNTER — Inpatient Hospital Stay (HOSPITAL_COMMUNITY): Payer: Medicaid Other

## 2013-12-24 DIAGNOSIS — Z3202 Encounter for pregnancy test, result negative: Secondary | ICD-10-CM | POA: Insufficient documentation

## 2013-12-24 DIAGNOSIS — R109 Unspecified abdominal pain: Secondary | ICD-10-CM | POA: Insufficient documentation

## 2013-12-24 LAB — CBC
HEMATOCRIT: 39 % (ref 36.0–46.0)
Hemoglobin: 13.2 g/dL (ref 12.0–15.0)
MCH: 30.3 pg (ref 26.0–34.0)
MCHC: 33.8 g/dL (ref 30.0–36.0)
MCV: 89.7 fL (ref 78.0–100.0)
Platelets: 283 10*3/uL (ref 150–400)
RBC: 4.35 MIL/uL (ref 3.87–5.11)
RDW: 13.4 % (ref 11.5–15.5)
WBC: 8.4 10*3/uL (ref 4.0–10.5)

## 2013-12-24 LAB — WET PREP, GENITAL
TRICH WET PREP: NONE SEEN
Yeast Wet Prep HPF POC: NONE SEEN

## 2013-12-24 LAB — URINALYSIS, ROUTINE W REFLEX MICROSCOPIC
BILIRUBIN URINE: NEGATIVE
Glucose, UA: NEGATIVE mg/dL
KETONES UR: NEGATIVE mg/dL
Leukocytes, UA: NEGATIVE
Nitrite: NEGATIVE
PROTEIN: NEGATIVE mg/dL
UROBILINOGEN UA: 0.2 mg/dL (ref 0.0–1.0)
pH: 5.5 (ref 5.0–8.0)

## 2013-12-24 LAB — URINE MICROSCOPIC-ADD ON

## 2013-12-24 LAB — HCG, SERUM, QUALITATIVE: Preg, Serum: NEGATIVE

## 2013-12-24 LAB — POCT PREGNANCY, URINE: Preg Test, Ur: NEGATIVE

## 2013-12-24 MED ORDER — HYDROMORPHONE HCL 1 MG/ML IJ SOLN
1.0000 mg | Freq: Once | INTRAMUSCULAR | Status: AC
  Administered 2013-12-24: 1 mg via INTRAVENOUS
  Filled 2013-12-24: qty 1

## 2013-12-24 MED ORDER — ONDANSETRON 8 MG PO TBDP
8.0000 mg | ORAL_TABLET | Freq: Once | ORAL | Status: AC
  Administered 2013-12-24: 8 mg via ORAL
  Filled 2013-12-24: qty 1

## 2013-12-24 MED ORDER — MEDROXYPROGESTERONE ACETATE 150 MG/ML IM SUSP
150.0000 mg | Freq: Once | INTRAMUSCULAR | Status: AC
  Administered 2013-12-24: 150 mg via INTRAMUSCULAR
  Filled 2013-12-24: qty 1

## 2013-12-24 NOTE — MAU Note (Signed)
Pt presents to MAU stating that she took a home pregnancy test and it was positive. Denies any vaginal bleeding or LOF

## 2013-12-24 NOTE — MAU Provider Note (Signed)
History     CSN: [id number]  Arrival date and time: 12/24/13 1528   First Provider Initiated Contact with Patient 12/24/13 1705      Chief Complaint  Patient presents with  . Possible Pregnancy   HPI  Pt is G5P2002 with hx of 2 ectopic pregnancies- is 2 weeks late for menses and had a positive home pregnancy test.  Pt had a little pain last night and this morning and has progressively gotten worse today.  Pt has had nausea and vomiting and breast tenderness.  Pt had some juice this morning to drink.  Pt has not taken anything for the pain. If pt is not pregnant, pt wants "shot" to prevent pregnancy Past Medical History  Diagnosis Date  . No pertinent past medical history   . Anemia   . Ovarian cyst   . Depression   . Abnormal Pap smear     Past Surgical History  Procedure Laterality Date  . Ovarian cyst removal    . Exploratory laparotomy      ovarian    Family History  Problem Relation Age of Onset  . Hypertension Mother   . Hyperlipidemia Mother   . Diabetes Father     History  Substance Use Topics  . Smoking status: Never Smoker   . Smokeless tobacco: Never Used  . Alcohol Use: No    Allergies: No Known Allergies  Prescriptions prior to admission  Medication Sig Dispense Refill Last Dose  . ondansetron (ZOFRAN ODT) 8 MG disintegrating tablet Take 1 tablet (8 mg total) by mouth every 8 (eight) hours as needed for nausea. (Patient not taking: Reported on 12/24/2013) 12 tablet 0   . oxyCODONE-acetaminophen (PERCOCET/ROXICET) 5-325 MG per tablet Take 2 tablets by mouth every 4 (four) hours as needed for pain. (Patient not taking: Reported on 12/24/2013) 15 tablet 0 07/31/2012 at 1600  . promethazine (PHENERGAN) 25 MG tablet Take 1 tablet (25 mg total) by mouth every 6 (six) hours as needed for nausea. (Patient not taking: Reported on 12/24/2013) 12 tablet 0 07/31/2012 at 1500    Review of Systems  Constitutional: Positive for chills. Negative for fever.   Gastrointestinal: Positive for nausea, vomiting and abdominal pain. Negative for diarrhea and constipation.  Genitourinary: Negative for dysuria and urgency.  Musculoskeletal: Positive for back pain.  Neurological: Positive for headaches.   Physical Exam   Blood pressure 105/76, pulse 70, temperature 97.5 F (36.4 C), resp. rate 18, height 4\' 8"  (1.422 m), weight 120 lb (54.432 kg), last menstrual period 12/10/2013.  Physical Exam  Vitals reviewed. Constitutional: She is oriented to person, place, and time. She appears well-developed and well-nourished. She appears distressed.  HENT:  Head: Normocephalic.  Eyes: Pupils are equal, round, and reactive to light.  Neck: Normal range of motion. Neck supple.  Cardiovascular: Normal rate.   Respiratory: Effort normal.  GI: Soft. She exhibits no distension. There is tenderness. There is guarding. There is no rebound.  Genitourinary:  Small amount of bright red blood in vault; cervix closed uterus and adnexa extremely tender especially right adnexa  Musculoskeletal: Normal range of motion.  Neurological: She is alert and oriented to person, place, and time.  Skin: Skin is warm and dry.  Psychiatric: She has a normal mood and affect.    MAU Course  Procedures Dilaudid 1mg  IM given for pain Results for orders placed or performed during the hospital encounter of 12/24/13 (from the past 24 hour(s))  Urinalysis, Routine w reflex microscopic  Status: Abnormal   Collection Time: 12/24/13  3:50 PM  Result Value Ref Range   Color, Urine YELLOW YELLOW   APPearance CLEAR CLEAR   Specific Gravity, Urine <1.005 (L) 1.005 - 1.030   pH 5.5 5.0 - 8.0   Glucose, UA NEGATIVE NEGATIVE mg/dL   Hgb urine dipstick LARGE (A) NEGATIVE   Bilirubin Urine NEGATIVE NEGATIVE   Ketones, ur NEGATIVE NEGATIVE mg/dL   Protein, ur NEGATIVE NEGATIVE mg/dL   Urobilinogen, UA 0.2 0.0 - 1.0 mg/dL   Nitrite NEGATIVE NEGATIVE   Leukocytes, UA NEGATIVE NEGATIVE   Urine microscopic-add on     Status: Abnormal   Collection Time: 12/24/13  3:50 PM  Result Value Ref Range   Squamous Epithelial / LPF FEW (A) RARE   WBC, UA 0-2 <3 WBC/hpf   RBC / HPF 3-6 <3 RBC/hpf  Pregnancy, urine POC     Status: None   Collection Time: 12/24/13  3:56 PM  Result Value Ref Range   Preg Test, Ur NEGATIVE NEGATIVE  CBC     Status: None   Collection Time: 12/24/13  4:40 PM  Result Value Ref Range   WBC 8.4 4.0 - 10.5 K/uL   RBC 4.35 3.87 - 5.11 MIL/uL   Hemoglobin 13.2 12.0 - 15.0 g/dL   HCT 16.139.0 09.636.0 - 04.546.0 %   MCV 89.7 78.0 - 100.0 fL   MCH 30.3 26.0 - 34.0 pg   MCHC 33.8 30.0 - 36.0 g/dL   RDW 40.913.4 81.111.5 - 91.415.5 %   Platelets 283 150 - 400 K/uL  hCG, serum, qualitative     Status: None   Collection Time: 12/24/13  4:40 PM  Result Value Ref Range   Preg, Serum NEGATIVE NEGATIVE  Wet prep, genital     Status: Abnormal   Collection Time: 12/24/13  5:10 PM  Result Value Ref Range   Yeast Wet Prep HPF POC NONE SEEN NONE SEEN   Trich, Wet Prep NONE SEEN NONE SEEN   Clue Cells Wet Prep HPF POC FEW (A) NONE SEEN   WBC, Wet Prep HPF POC FEW (A) NONE SEEN  [place] Transvaginal Non-ob  12/24/2013   CLINICAL DATA:  Right lower quadrant pain since midnight last night. History of a previous laparoscopic surgery for ovarian cyst resection.  EXAM: TRANSABDOMINAL AND TRANSVAGINAL ULTRASOUND OF PELVIS  TECHNIQUE: Both transabdominal and transvaginal ultrasound examinations of the pelvis were performed. Transabdominal technique was performed for global imaging of the pelvis including uterus, ovaries, adnexal regions, and pelvic cul-de-sac. It was necessary to proceed with endovaginal exam following the transabdominal exam to visualize the endometrium and ovaries to better advantage.  COMPARISON:  None  FINDINGS: Uterus  Measurements: 8.7 cm x 4.6 cm x 5.2 cm. No fibroids or other mass visualized.  Endometrium  Thickness: 2.3 mm.  No focal abnormality visualized.  Right ovary   Measurements: 3.4 cm x 1.8 cm x 1.9 cm. Normal appearance/no adnexal mass.  Left ovary  Measurements: 3.1 cm x 1.6 cm x 2.8 cm. Normal appearance/no adnexal mass.  Other findings  No free fluid.  IMPRESSION: Normal transabdominal and endovaginal pelvic ultrasound.   Electronically Signed   By: Amie Portlandavid  Ormond M.D.   On: 12/24/2013 18:55   [place] Pelvis Complete  12/24/2013   CLINICAL DATA:  Right lower quadrant pain since midnight last night. History of a previous laparoscopic surgery for ovarian cyst resection.  EXAM: TRANSABDOMINAL AND TRANSVAGINAL ULTRASOUND OF PELVIS  TECHNIQUE: Both transabdominal and transvaginal ultrasound examinations of  the pelvis were performed. Transabdominal technique was performed for global imaging of the pelvis including uterus, ovaries, adnexal regions, and pelvic cul-de-sac. It was necessary to proceed with endovaginal exam following the transabdominal exam to visualize the endometrium and ovaries to better advantage.  COMPARISON:  None  FINDINGS: Uterus  Measurements: 8.7 cm x 4.6 cm x 5.2 cm. No fibroids or other mass visualized.  Endometrium  Thickness: 2.3 mm.  No focal abnormality visualized.  Right ovary  Measurements: 3.4 cm x 1.8 cm x 1.9 cm. Normal appearance/no adnexal mass.  Left ovary  Measurements: 3.1 cm x 1.6 cm x 2.8 cm. Normal appearance/no adnexal mass.  Other findings  No free fluid.  IMPRESSION: Normal transabdominal and endovaginal pelvic ultrasound.   Electronically Signed   By: Amie Portlandavid  Ormond M.D.   On: 12/24/2013 18:55  Dilaudid 1 mg given for pain zofran 8 mg ODT given for nausea Pt able to tolerate PO fluids and crackers Depo Provera 150mg  IM given Assessment and Plan  Abdominal pain - normal ultrasound findings F/u in GYN clinic in 3 mos  Marissa Little 12/24/2013, 5:18 PM

## 2013-12-24 NOTE — Discharge Instructions (Signed)
Dolor abdominal en las mujeres °(Abdominal Pain, Women) °El dolor abdominal (en el estómago, la pelvis o el vientre) puede tener muchas causas. Es importante que le informe a su médico: °· La ubicación del dolor. °· ¿Viene y se va, o persiste todo el tiempo? °· ¿Hay situaciones que inician el dolor (comer ciertos alimentos, la actividad física)? °· ¿Tiene otros síntomas asociados al dolor (fiebre, náuseas, vómitos, diarrea)? °Todo es de gran ayuda cuando se trata de hallar la causa del dolor. °CAUSAS °· Estómago: Infecciones por virus o bacterias, o úlcera. °· Intestino: Apendicitis (apéndice inflamado), ileitis regional (enfermedad de Crohn), colitis ulcerosa (colon inflamado), síndrome del colon irritable, diverticulitis (inflamación de los divertículos del colon) o cáncer de estómago oo intestino. °· Enfermedades de la vesícula biliar o cálculos. °· Enfermedades renales, cálculos o infecciones en el riñón. °· Infección o cáncer del páncreas. °· Fibromialgia (trastorno doloroso) °· Enfermedades de los órganos femeninos: °¨ Uterus: Útero: fibroma (tumor no canceroso) o infección °¨ Trompas de Falopio: infección o embarazo ectópico °¨ En los ovarios, quistes o tumores. °¨ Adherencias pélvicas (tejido cicatrizal). °¨ Endometriosis (el tejido que cubre el útero se desarrolla en la pelvis y los órganos pélvicos). °¨ Síndrome de congestión pélvica (los órganos femeninos se llenan de sangre antes del periodo menstrual( °¨ Dolor durante el periodo menstrual. °¨ Dolor durante la ovulación (al producir óvulos). °¨ Dolor al usar el DIU (dispositivo intrauterino para el control de la natalidad) °¨ Cáncer en los órganos femeninos. °· Dolor funcional (no está originado en una enfermedad, puede mejorar sin tratamiento). °· Dolor de origen psicológico °· Depresión. °DIAGNÓSTICO °Su médico decidirá la gravedad del dolor a través del examen físico °· Análisis de sangre °· Radiografías °· Ecografías °· TC (tomografía computada, tipo  especial de radiografías). °· IMR (resonancia magnética) °· Cultivos, en el caso una infección °· Colon por enema de bario (se inserta una sustancia de contraste en el intestino grueso para mejorar la observación con rayos X.) °· Colonoscopía (observación del intestino con un tubo luminoso). °· Laparoscopía (examen del interior del abdomen con un tubo que tiene una luz). °· Cirugía exploratoria abdominal mayor (se observa el abdomen realizando una gran incisión). °TRATAMIENTO °El tratamiento dependerá de la causa del problema.  °· Muchos de estos casos pueden controlarse y tratarse en casa. °· Medicamentos de venta libre indicados por el médico. °· Medicamentos con receta. °· Antibióticos, en caso de infección °· Píldoras anticonceptivas, en el caso de períodos dolorosos o dolor al ovular. °· Tratamiento hormonal, para la endometriosis °· Inyecciones para bloqueo nervioso selectivo. °· Fisioterapia. °· Antidepresivos. °· Consejos por parte de un psícólogo o psiquiatra. °· Cirugía mayor o menor. °INSTRUCCIONES PARA EL CUIDADO DOMICILIARIO °· No tome ni administre laxantes a menos que se lo haya indicado su médico. °· Tome analgésicos de venta libre sólo si se lo ha indicado el profesional que lo asiste. No tome aspirina, ya que puede causar molestias en el estómago o hemorragias. °· Consuma una dieta líquida (caldo o agua) según lo indicado por el médico. Progrese lentamente a una dieta blanda, según la tolerancia, si el dolor se relaciona con el estómago o el intestino. °· Tenga un termómetro y tómese la temperatura varias veces al día. °· Haga reposo en la cama y duerma, si esto alivia el dolor. °· Evite las relaciones sexuales, si le producen dolor. °· Evite las situaciones estresantes. °· Cumpla con las visitas y los análisis de control, según las indicaciones de su médico. °· Si el dolor   no se alivia con los medicamentos o la cirugía, puede tratar con: °¨ Acupuntura. °¨ Ejercicios de relajación (yoga,  meditación). °¨ Terapia grupal. °¨ Psicoterapia. °SOLICITE ATENCIÓN MÉDICA SI: °· Nota que ciertos alimentos le producen dolor de estómago. °· El tratamiento indicado para realizar en el hogar no le alivia el dolor. °· Necesita analgésicos más fuertes. °· Quiere que le retiren el DIU. °· Si se siente confundido o desfalleciente. °· Presenta náuseas o vómitos. °· Aparece una erupción cutánea. °· Sufre efectos adversos o una reacción alérgica debido a los medicamentos que toma. °SOLICITE ATENCIÓN MÉDICA DE INMEDIATO SI: °· El dolor persiste o se agrava. °· Tiene fiebre. °· Siente el dolor sólo en algunos sectores del abdomen. Si se localiza en la zona derecha, posiblemente podría tratarse de apendicitis. En un adulto, si se localiza en la región inferior izquierda del abdomen, podría tratarse de colitis o diverticulitis. °· Hay sangre en las heces (deposiciones de color rojo brillante o negro alquitranado), con o sin vómitos. °· Usted presenta sangre en la orina. °· Siente escalofríos con o sin fiebre. °· Se desmaya. °ASEGÚRESE QUE:  °· Comprende estas instrucciones. °· Controlará su enfermedad. °· Solicitará ayuda de inmediato si no mejora o si empeora. °Document Released: 05/22/2008 Document Revised: 04/28/2011 °[employer]® Patient Information ©2015 [employer]. This information is not intended to replace advice given to you by your health care provider. Make sure you discuss any questions you have with your health care provider. ° °

## 2013-12-25 NOTE — Progress Notes (Signed)
Assisted RN in interpretation during Triage  Marissa Little - Spanish Interpreter

## 2013-12-25 NOTE — Progress Notes (Signed)
Assisted Ultrasound tech with interpretation for ultrasound procedure Marissa Little - Spanish Interpreter

## 2013-12-25 NOTE — Progress Notes (Signed)
Assisted RN with interpretation of discharge instructions. Benita Mordecai MaesSanchez - [place] [employer] Interpre[employer]

## 2013-12-25 NOTE — Progress Notes (Signed)
Assisted RN with interpretation of initial assessment Benita Mordecai MaesSanchez - Spanish Interpreter

## 2013-12-26 LAB — GC/CHLAMYDIA PROBE AMP
CT PROBE, AMP APTIMA: NEGATIVE
GC PROBE AMP APTIMA: NEGATIVE

## 2013-12-26 LAB — HIV ANTIBODY (ROUTINE TESTING W REFLEX): HIV 1&2 Ab, 4th Generation: NONREACTIVE

## 2015-01-02 IMAGING — US US OB TRANSVAGINAL
1 series · 14 of 23 positions shown · non-contrast
Comparison: 07/29/2012.

CLINICAL DATA: Pelvic pain.  Reassess right adnexal probable
ectopic pregnancy.  Quantitative beta HCG 2755.

TRANSVAGINAL OBSTETRIC US
TECHNIQUE: Transvaginal ultrasound was performed for complete
evaluation of the gestation as well as the maternal uterus, adnexal
regions, and pelvic cul-de-sac.

[Series 1: us ob transvaginal · 23 acquisitions, 14 frames shown]
[im 1/23]
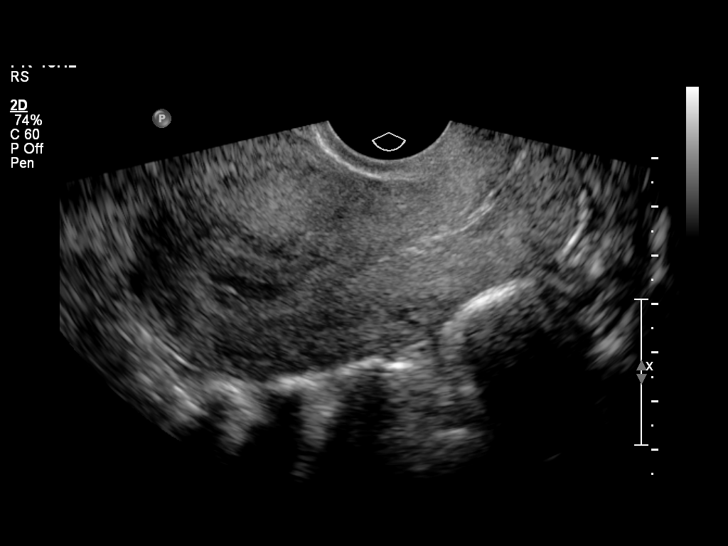
[im 3/23]
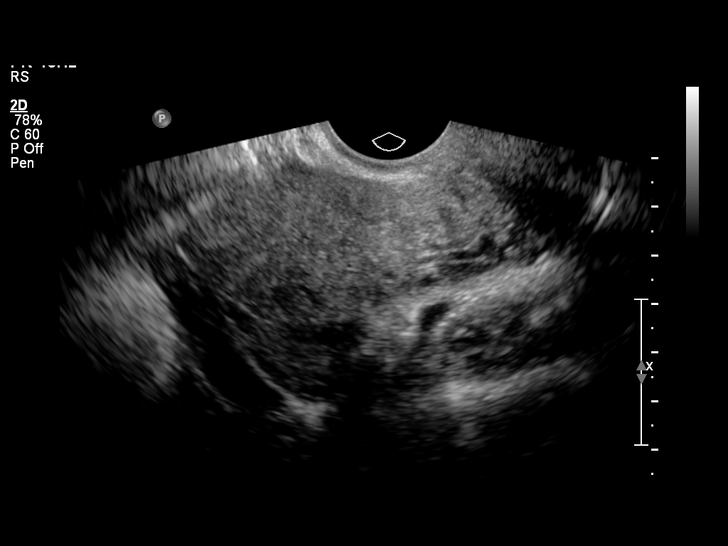
[im 5/23]
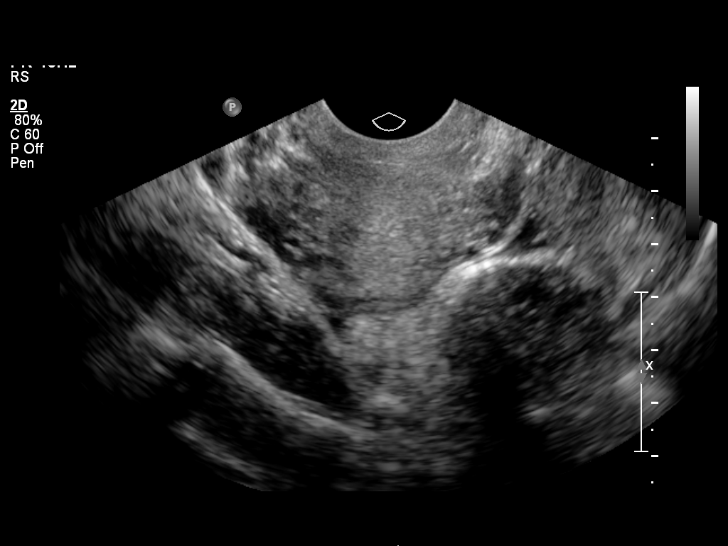
[im 6/23]
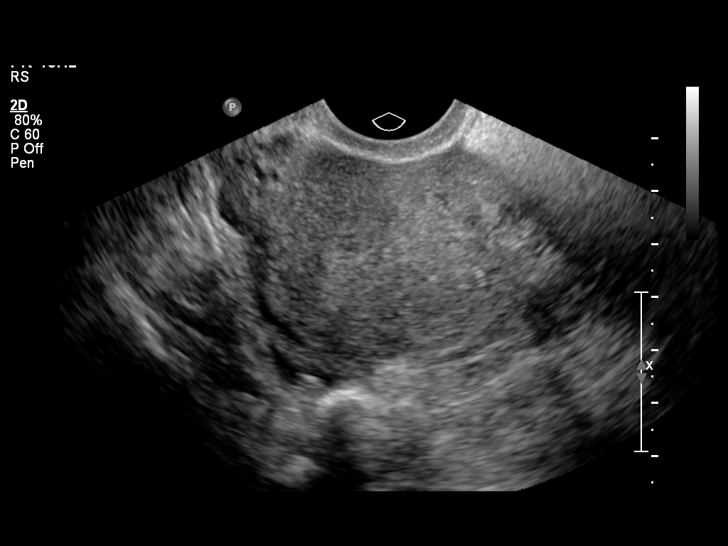
[im 8/23]
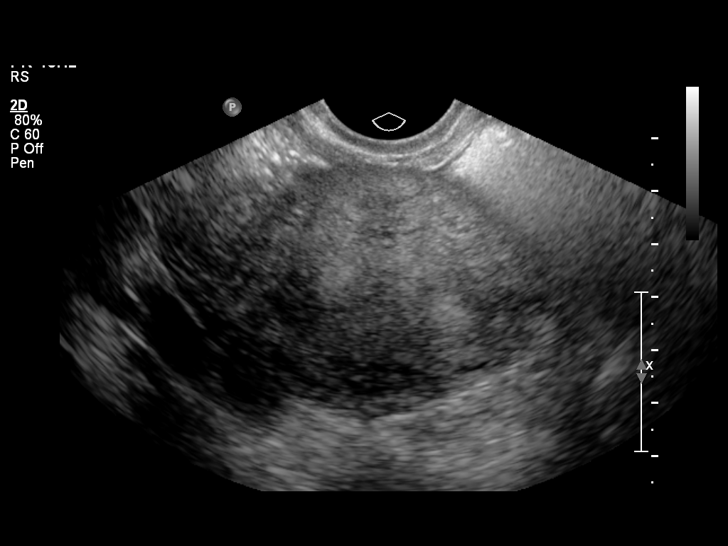
[im 10/23]
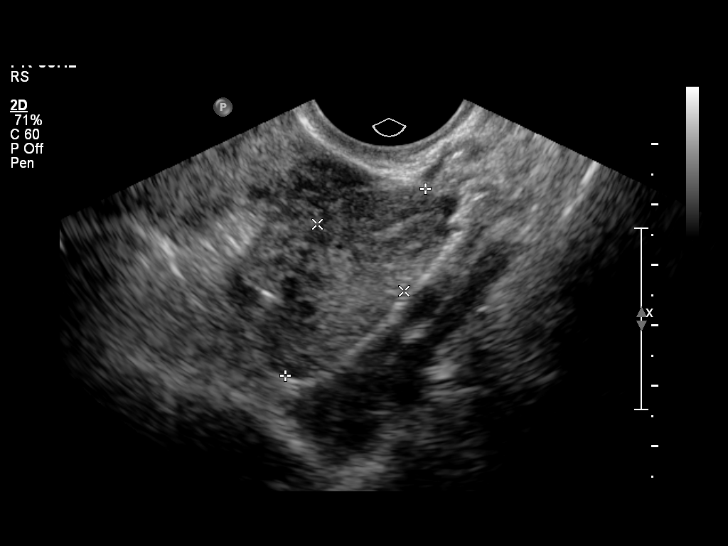
[im 11/23]
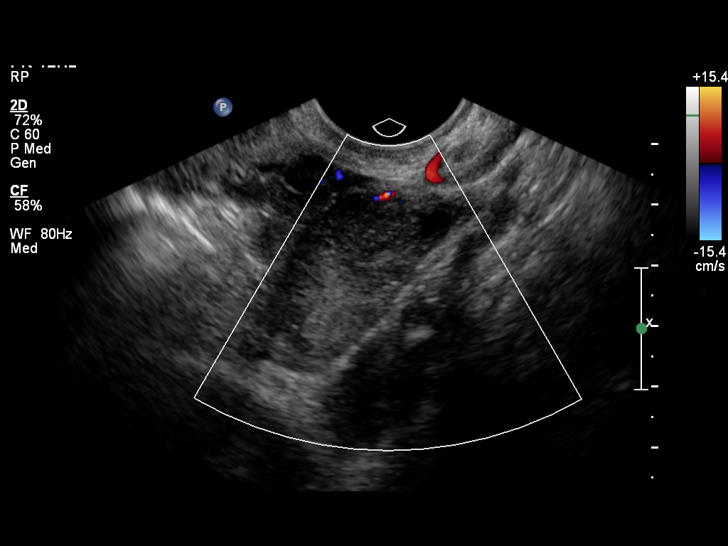
[im 13/23]
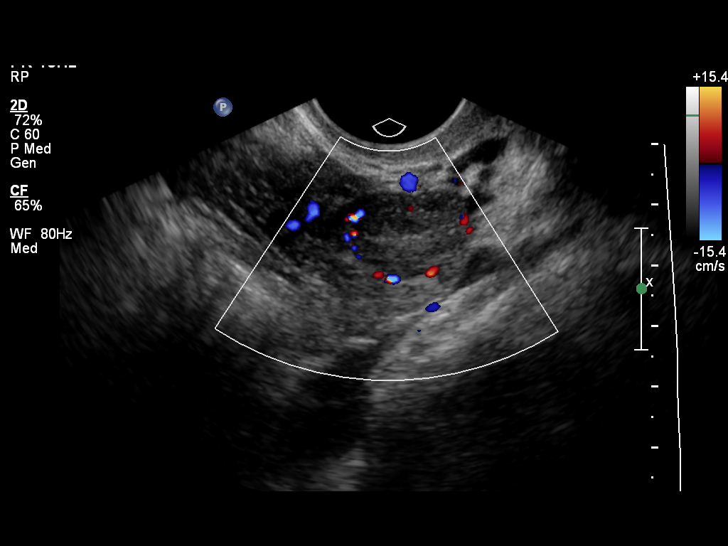
[im 14/23]
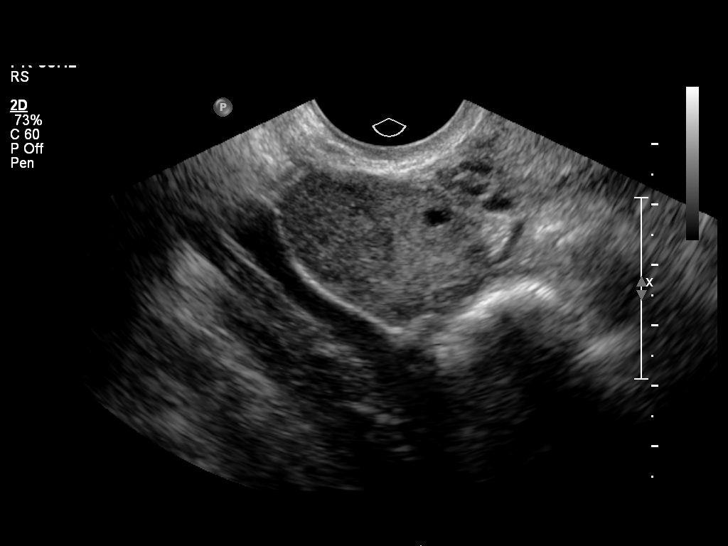
[im 16/23]
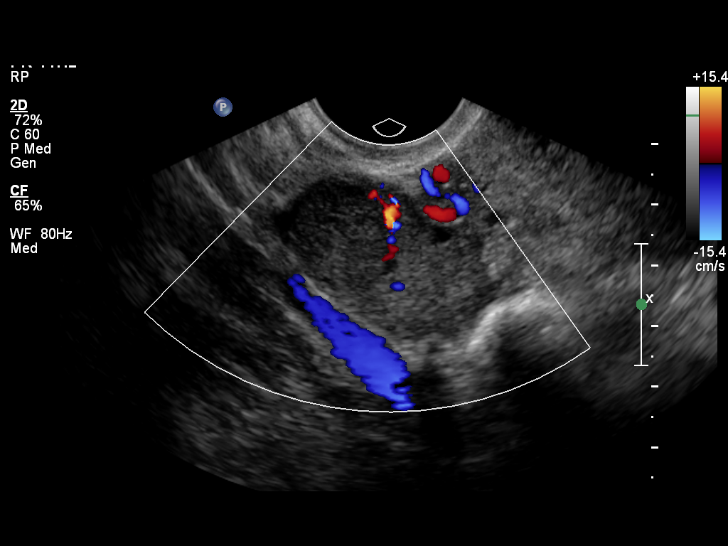
[im 18/23]
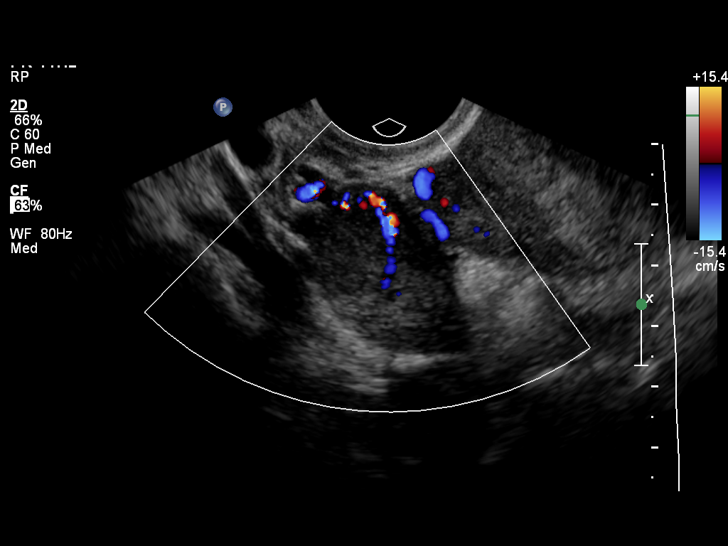
[im 19/23]
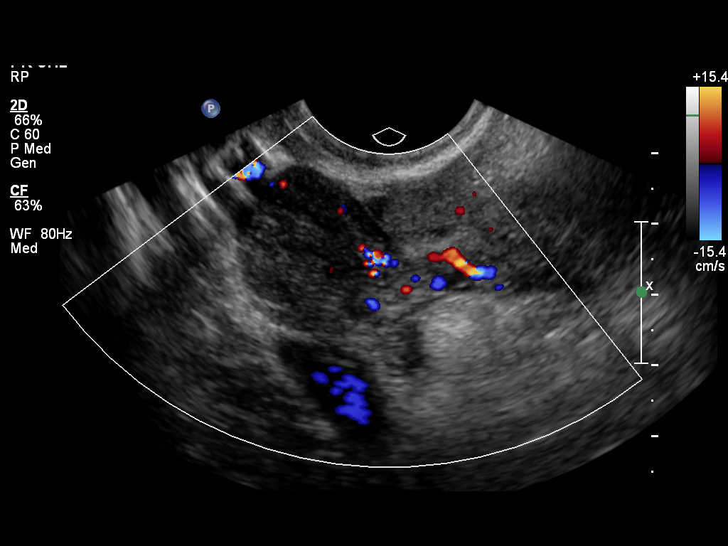
[im 21/23]
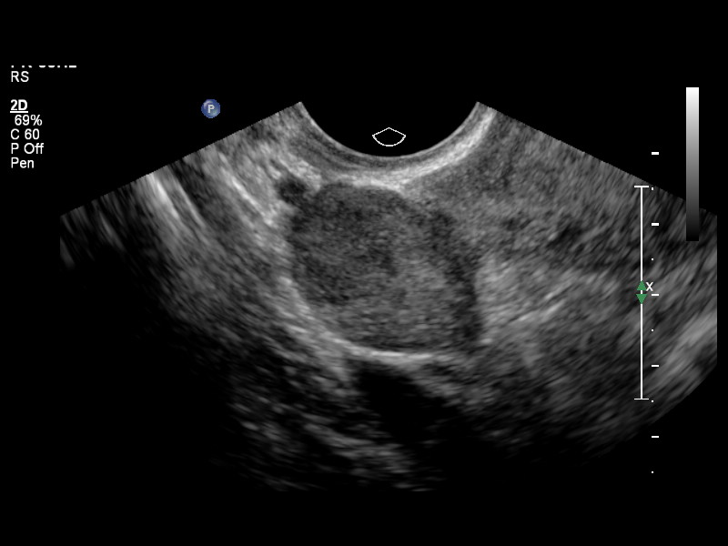
[im 23/23]
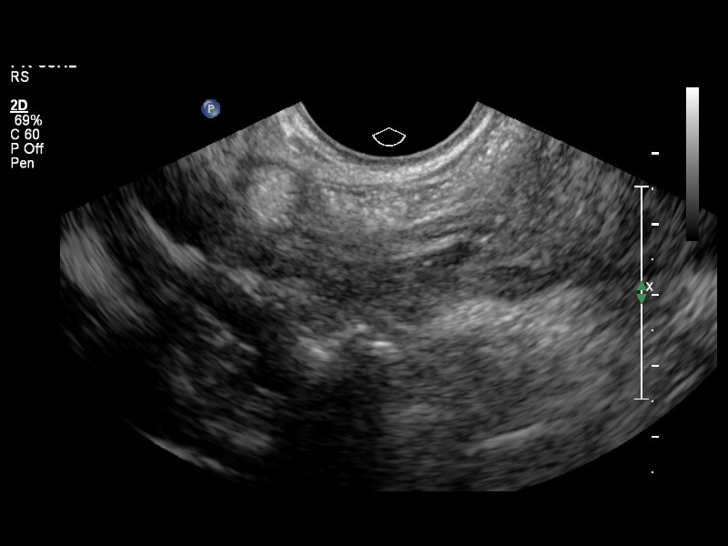

[14 of 23 positions shown; findings below may reference images not displayed]

Intrauterine gestational sac: No
Yolk sac: No
Embryo: No
Cardiac Activity: No

Maternal uterus/adnexae:
Normal appearing maternal ovaries.  The previously seen right
adnexal mass is no longer demonstrated.  Trace amount of free
peritoneal fluid, within normal limits of physiological fluid.
IMPRESSION: The previously seen probable right adnexal ectopic pregnancy is no
longer visualized.  No acute abnormality.

## 2016-05-26 IMAGING — US US TRANSVAGINAL NON-OB
1 series · 14 of 25 positions shown · non-contrast
Comparison: None

CLINICAL DATA: Right lower quadrant pain since midnight last night.
History of a previous laparoscopic surgery for ovarian cyst
resection.

EXAM:
TRANSABDOMINAL AND TRANSVAGINAL ULTRASOUND OF PELVIS
TECHNIQUE: Both transabdominal and transvaginal ultrasound examinations of the
pelvis were performed. Transabdominal technique was performed for
global imaging of the pelvis including uterus, ovaries, adnexal
regions, and pelvic cul-de-sac. It was necessary to proceed with
endovaginal exam following the transabdominal exam to visualize the
endometrium and ovaries to better advantage.

[Series 1: us transvaginal non-ob · 14 of 55 slices shown]
[im 1/55]
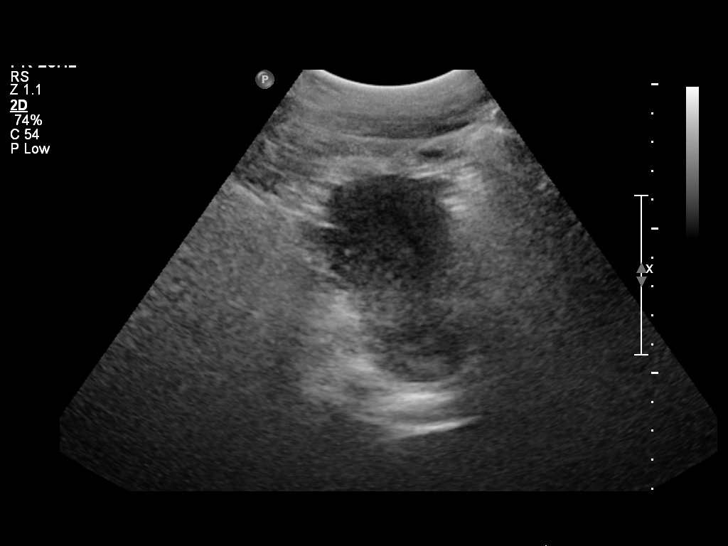
[im 5/55]
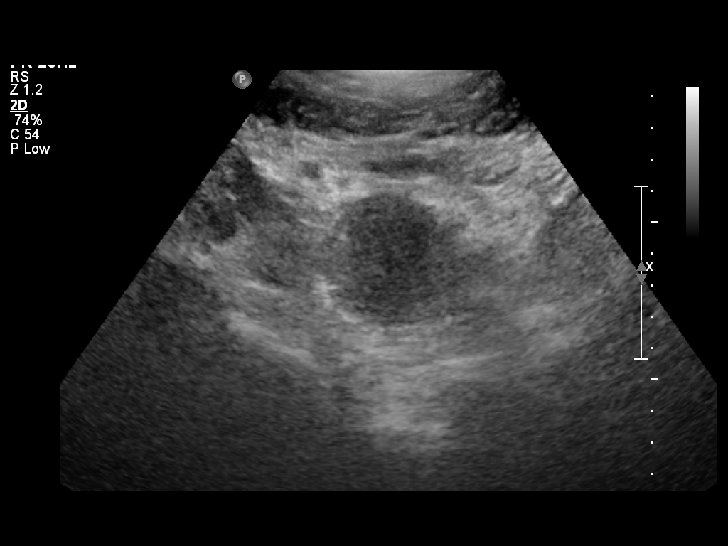
[im 10/55]
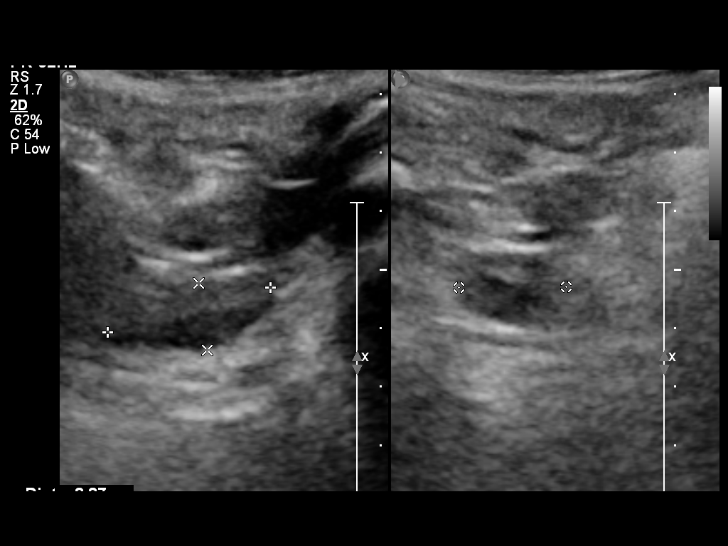
[im 14/55]
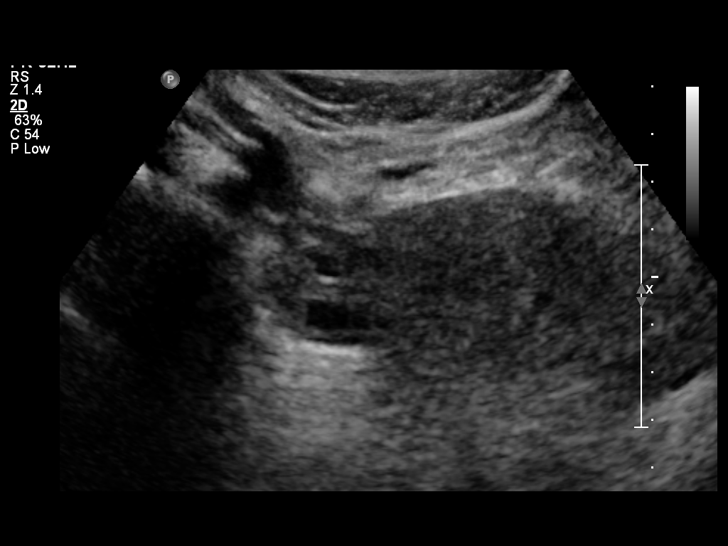
[im 19/55]
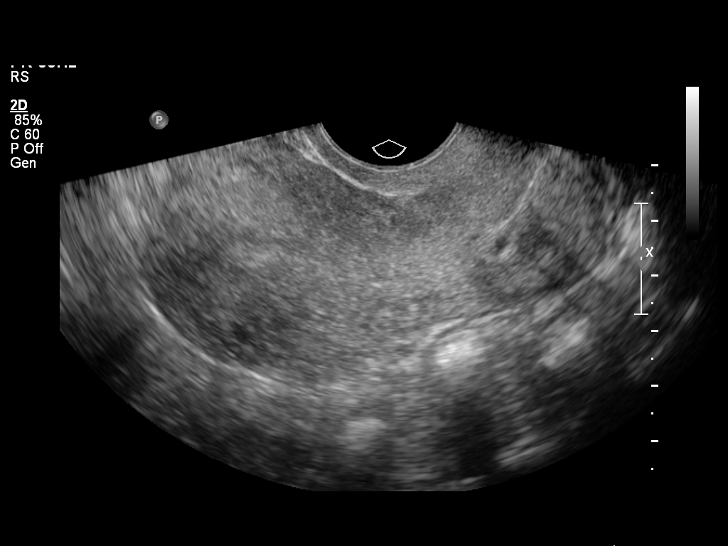
[im 21/55]
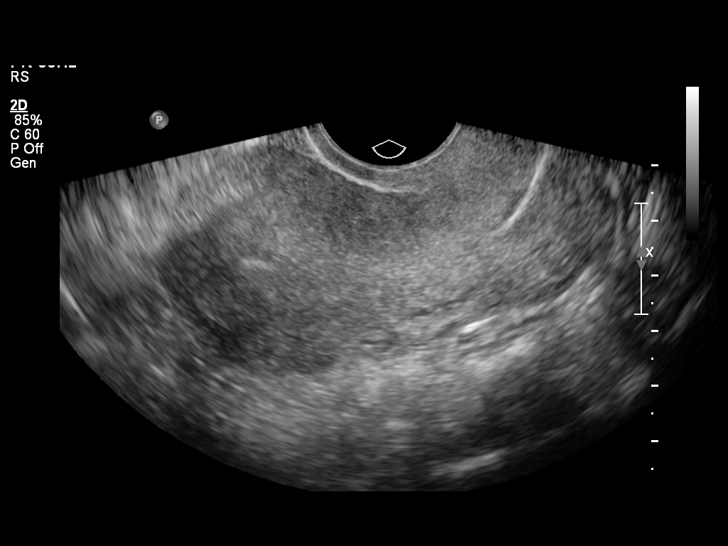
[im 25/55]
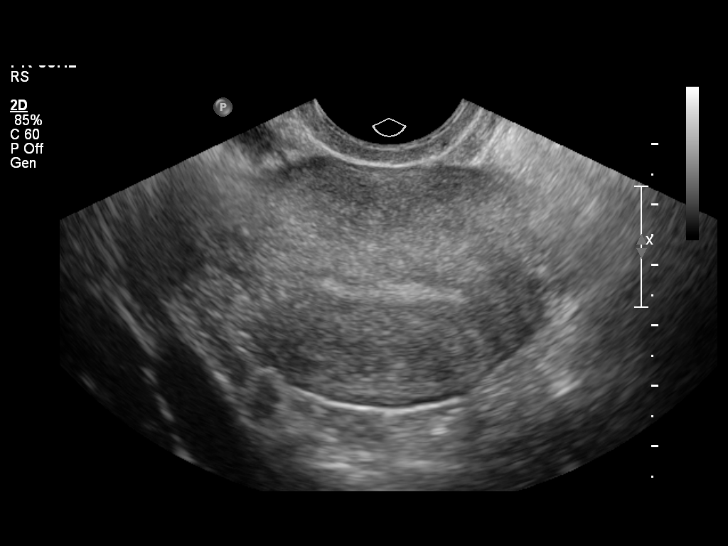
[im 30/55]
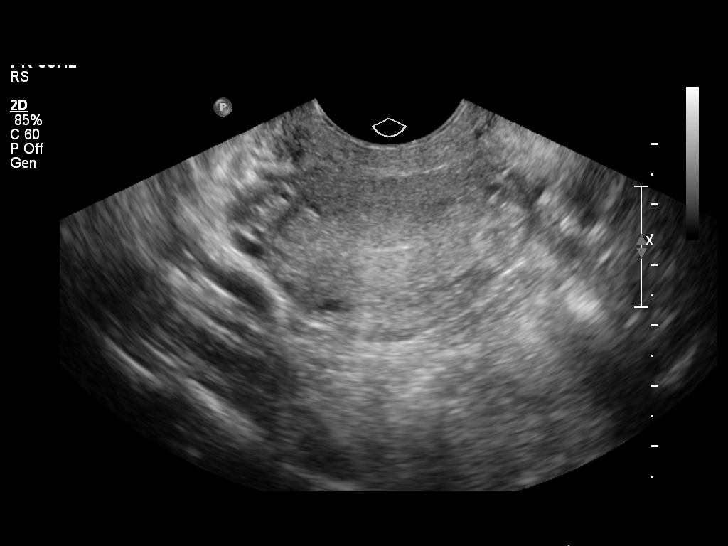
[im 34/55]
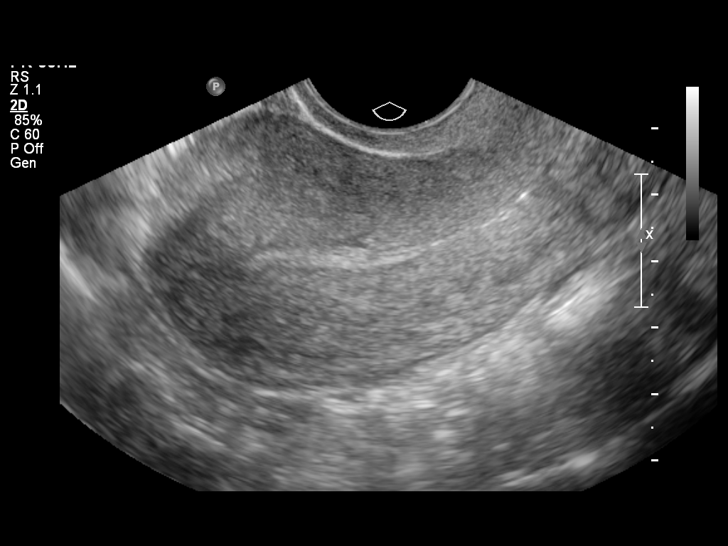
[im 37/55]
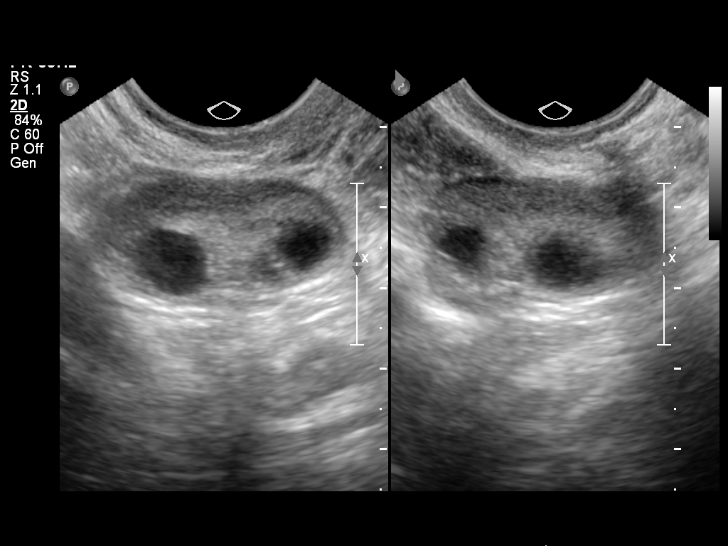
[im 41/55]
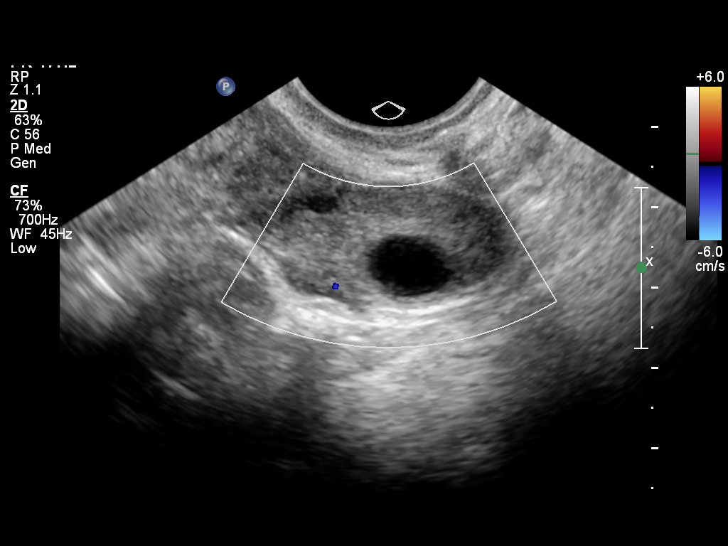
[im 46/55]
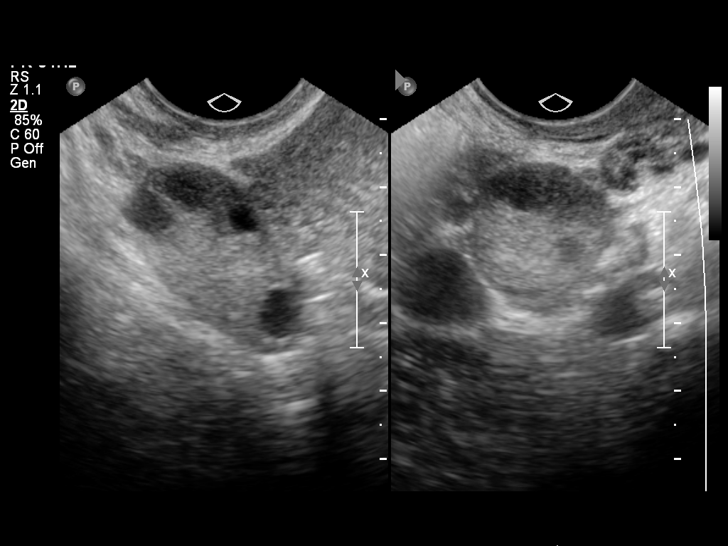
[im 50/55]
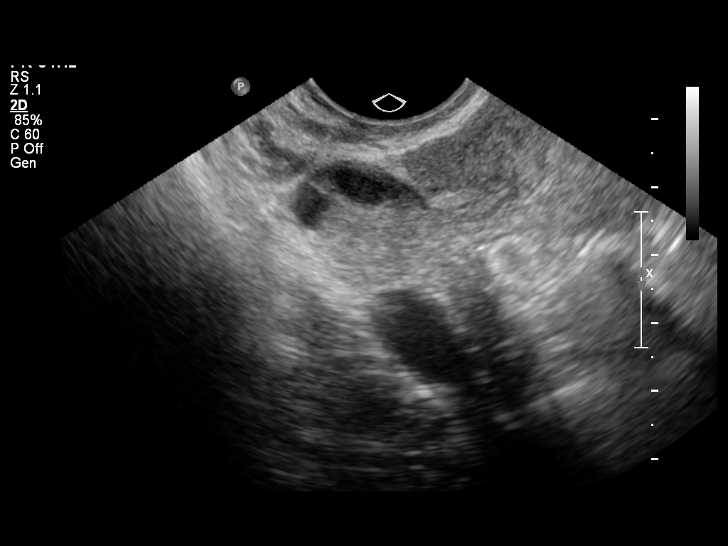
[im 55/55]
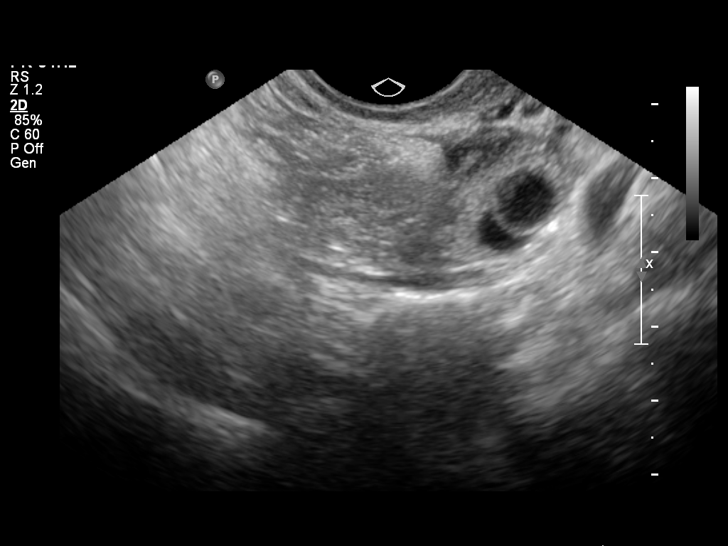

[14 of 25 positions shown; findings below may reference images not displayed]

FINDINGS: Uterus

Measurements: 8.7 cm x 4.6 cm x 5.2 cm. No fibroids or other mass
visualized.

Endometrium

Thickness: 2.3 mm.  No focal abnormality visualized.

Right ovary

Measurements: 3.4 cm x 1.8 cm x 1.9 cm. Normal appearance/no adnexal
mass.

Left ovary

Measurements: 3.1 cm x 1.6 cm x 2.8 cm. Normal appearance/no adnexal
mass.

Other findings

No free fluid.
IMPRESSION: Normal transabdominal and endovaginal pelvic ultrasound.
